# Patient Record
Sex: Female | Born: 1935 | Race: White | Hispanic: No | Smoking: Former smoker
Health system: Southern US, Community
[De-identification: ages and names within clinical notes are randomized; demographics above are authoritative.]

## PROBLEM LIST (undated history)

## (undated) DIAGNOSIS — I10 Essential (primary) hypertension: Secondary | ICD-10-CM

## (undated) DIAGNOSIS — F419 Anxiety disorder, unspecified: Secondary | ICD-10-CM

## (undated) DIAGNOSIS — J449 Chronic obstructive pulmonary disease, unspecified: Secondary | ICD-10-CM

## (undated) DIAGNOSIS — E785 Hyperlipidemia, unspecified: Secondary | ICD-10-CM

## (undated) DIAGNOSIS — I639 Cerebral infarction, unspecified: Secondary | ICD-10-CM

## (undated) DIAGNOSIS — F0390 Unspecified dementia without behavioral disturbance: Secondary | ICD-10-CM

## (undated) HISTORY — DX: Anxiety disorder, unspecified: F41.9

## (undated) HISTORY — DX: Hyperlipidemia, unspecified: E78.5

## (undated) HISTORY — DX: Cerebral infarction, unspecified: I63.9

## (undated) HISTORY — DX: Chronic obstructive pulmonary disease, unspecified: J44.9

## (undated) HISTORY — DX: Essential (primary) hypertension: I10

## (undated) HISTORY — DX: Unspecified dementia, unspecified severity, without behavioral disturbance, psychotic disturbance, mood disturbance, and anxiety: F03.90

---

## 1974-09-30 HISTORY — PX: APPENDECTOMY: SHX54

## 1974-09-30 HISTORY — PX: VESICOVAGINAL FISTULA CLOSURE W/ TAH: SUR271

## 1998-09-30 DIAGNOSIS — I639 Cerebral infarction, unspecified: Secondary | ICD-10-CM

## 1998-09-30 HISTORY — DX: Cerebral infarction, unspecified: I63.9

## 2000-10-09 ENCOUNTER — Ambulatory Visit: Admission: RE | Admit: 2000-10-09 | Discharge: 2000-10-09 | Payer: Self-pay | Admitting: Vascular Surgery

## 2000-10-09 ENCOUNTER — Encounter: Payer: Self-pay | Admitting: Vascular Surgery

## 2000-10-20 ENCOUNTER — Inpatient Hospital Stay (HOSPITAL_COMMUNITY): Admission: RE | Admit: 2000-10-20 | Discharge: 2000-10-21 | Payer: Self-pay | Admitting: Vascular Surgery

## 2001-06-15 ENCOUNTER — Encounter: Payer: Self-pay | Admitting: Family Medicine

## 2001-06-15 ENCOUNTER — Ambulatory Visit (HOSPITAL_COMMUNITY): Admission: RE | Admit: 2001-06-15 | Discharge: 2001-06-15 | Payer: Self-pay | Admitting: Pediatrics

## 2002-07-06 ENCOUNTER — Encounter: Payer: Self-pay | Admitting: Family Medicine

## 2002-07-06 ENCOUNTER — Ambulatory Visit (HOSPITAL_COMMUNITY): Admission: RE | Admit: 2002-07-06 | Discharge: 2002-07-06 | Payer: Self-pay | Admitting: Family Medicine

## 2002-09-10 ENCOUNTER — Ambulatory Visit (HOSPITAL_COMMUNITY): Admission: RE | Admit: 2002-09-10 | Discharge: 2002-09-10 | Payer: Self-pay | Admitting: Internal Medicine

## 2003-03-10 ENCOUNTER — Ambulatory Visit (HOSPITAL_COMMUNITY): Admission: RE | Admit: 2003-03-10 | Discharge: 2003-03-10 | Payer: Self-pay

## 2003-07-11 ENCOUNTER — Encounter: Payer: Self-pay | Admitting: Internal Medicine

## 2003-07-11 ENCOUNTER — Ambulatory Visit (HOSPITAL_COMMUNITY): Admission: RE | Admit: 2003-07-11 | Discharge: 2003-07-11 | Payer: Self-pay | Admitting: Internal Medicine

## 2004-07-12 ENCOUNTER — Ambulatory Visit (HOSPITAL_COMMUNITY): Admission: RE | Admit: 2004-07-12 | Discharge: 2004-07-12 | Payer: Self-pay | Admitting: Internal Medicine

## 2006-06-04 ENCOUNTER — Ambulatory Visit: Payer: Self-pay | Admitting: Internal Medicine

## 2006-06-06 ENCOUNTER — Ambulatory Visit (HOSPITAL_COMMUNITY): Admission: RE | Admit: 2006-06-06 | Discharge: 2006-06-06 | Payer: Self-pay | Admitting: Internal Medicine

## 2006-06-06 ENCOUNTER — Ambulatory Visit: Payer: Self-pay | Admitting: Internal Medicine

## 2013-03-08 ENCOUNTER — Other Ambulatory Visit: Payer: Self-pay | Admitting: Internal Medicine

## 2013-03-08 DIAGNOSIS — R921 Mammographic calcification found on diagnostic imaging of breast: Secondary | ICD-10-CM

## 2013-03-18 ENCOUNTER — Ambulatory Visit
Admission: RE | Admit: 2013-03-18 | Discharge: 2013-03-18 | Disposition: A | Discharge status: Home / Self Care | Payer: Medicare Other | Source: Ambulatory Visit | Attending: Internal Medicine | Admitting: Internal Medicine

## 2013-03-18 DIAGNOSIS — R921 Mammographic calcification found on diagnostic imaging of breast: Secondary | ICD-10-CM

## 2015-07-11 ENCOUNTER — Ambulatory Visit (INDEPENDENT_AMBULATORY_CARE_PROVIDER_SITE_OTHER): Payer: Medicare Other | Admitting: Internal Medicine

## 2015-07-11 ENCOUNTER — Ambulatory Visit (INDEPENDENT_AMBULATORY_CARE_PROVIDER_SITE_OTHER)
Admission: RE | Admit: 2015-07-11 | Discharge: 2015-07-11 | Disposition: A | Discharge status: Home / Self Care | Payer: Medicare Other | Source: Ambulatory Visit | Attending: Internal Medicine | Admitting: Internal Medicine

## 2015-07-11 ENCOUNTER — Encounter: Payer: Self-pay | Admitting: Internal Medicine

## 2015-07-11 ENCOUNTER — Other Ambulatory Visit (INDEPENDENT_AMBULATORY_CARE_PROVIDER_SITE_OTHER): Payer: Medicare Other

## 2015-07-11 VITALS — BP 104/64 | HR 87 | Ht 63.0 in | Wt 134.0 lb

## 2015-07-11 DIAGNOSIS — R0609 Other forms of dyspnea: Secondary | ICD-10-CM

## 2015-07-11 DIAGNOSIS — J449 Chronic obstructive pulmonary disease, unspecified: Secondary | ICD-10-CM

## 2015-07-11 DIAGNOSIS — J9611 Chronic respiratory failure with hypoxia: Secondary | ICD-10-CM

## 2015-07-11 DIAGNOSIS — R0600 Dyspnea, unspecified: Secondary | ICD-10-CM

## 2015-07-11 LAB — BASIC METABOLIC PANEL
BUN: 16 mg/dL (ref 6–23)
CO2: 30 mEq/L (ref 19–32)
Calcium: 9.4 mg/dL (ref 8.4–10.5)
Chloride: 94 mEq/L — ABNORMAL LOW (ref 96–112)
Creatinine, Ser: 0.97 mg/dL (ref 0.40–1.20)
GFR: 59.03 mL/min — AB (ref >60.00)
Glucose, Bld: 94 mg/dL (ref 70–99)
POTASSIUM: 4.2 meq/L (ref 3.5–5.1)
Sodium: 133 mEq/L — ABNORMAL LOW (ref 135–145)

## 2015-07-11 LAB — CBC WITH DIFFERENTIAL/PLATELET
BASOS ABS: 0.1 10*3/uL (ref 0.0–0.1)
Basophils Relative: 0.5 % (ref 0.0–3.0)
Eosinophils Absolute: 2.2 10*3/uL — ABNORMAL HIGH (ref 0.0–0.7)
Eosinophils Relative: 19.5 % — ABNORMAL HIGH (ref 0.0–5.0)
HCT: 40.7 % (ref 36.0–46.0)
HEMOGLOBIN: 13.3 g/dL (ref 12.0–15.0)
LYMPHS ABS: 3.4 10*3/uL (ref 0.7–4.0)
Lymphocytes Relative: 29.9 % (ref 12.0–46.0)
MCHC: 32.8 g/dL (ref 30.0–36.0)
MCV: 87 fl (ref 78.0–100.0)
MONO ABS: 0.6 10*3/uL (ref 0.1–1.0)
MONOS PCT: 5.5 % (ref 3.0–12.0)
NEUTROS PCT: 44.6 % (ref 43.0–77.0)
Neutro Abs: 5.1 10*3/uL (ref 1.4–7.7)
Platelets: 273 10*3/uL (ref 150.0–400.0)
RBC: 4.67 Mil/uL (ref 3.87–5.11)
RDW: 13.5 % (ref 11.5–15.5)
WBC: 11.4 10*3/uL — AB (ref 4.0–10.5)

## 2015-07-11 LAB — TSH: TSH: 0.83 u[IU]/mL (ref 0.35–4.50)

## 2015-07-11 LAB — BRAIN NATRIURETIC PEPTIDE: PRO B NATRI PEPTIDE: 127 pg/mL — AB (ref 0.0–100.0)

## 2015-07-11 MED ORDER — PREDNISONE 10 MG PO TABS: ORAL_TABLET | ORAL | Status: DC

## 2015-07-11 MED ORDER — FLUTTER DEVI: Status: AC

## 2015-07-11 NOTE — Progress Notes (Signed)
Quick Note:  Spoke with pt and notified of results per Dr. Wert. Pt verbalized understanding and denied any questions.  ______ 

## 2015-07-11 NOTE — Patient Instructions (Addendum)
Prednisone 10 mg take  4 each am x 2 days,   2 each am x 2 days,  1 each am x 2 days and stop   duoneb is four times daily   Only use your albuterol (Proair)  as a rescue medication to be used if you can't catch your breath by resting or doing a relaxed purse lip breathing pattern.  - The less you use it, the better it will work when you need it. - Ok to use up to 2 puffs  every 4 hours if you must but call for immediate appointment if use goes up over your usual need - Don't leave home without it !!  (think of it like the spare tire for your car)   Work on inhaler technique:  relax and gently blow all the way out then take a nice smooth deep breath back in, triggering the inhaler at same time you start breathing in.  Hold for up to 5 seconds if you can. Blow out thru nose. Rinse and gargle with water when done   Omeprazole Take 30- 60 min before your first and last meals of the day and Add pepcid 20 mg at bedtime   GERD (REFLUX)  is an extremely common cause of respiratory symptoms just like yours , many times with no obvious heartburn at all.    It can be treated with medication, but also with lifestyle changes including elevation of the head of your bed (ideally with 6 inch  bed blocks),  Smoking cessation, avoidance of late meals, excessive alcohol, and avoid fatty foods, chocolate, peppermint, colas, red wine, and acidic juices such as orange juice.  NO MINT OR MENTHOL PRODUCTS SO NO COUGH DROPS  USE SUGARLESS CANDY INSTEAD (Jolley ranchers or Stover's or Life Savers) or even ice chips will also do - the key is to swallow to prevent all throat clearing. NO OIL BASED VITAMINS - use powdered substitutes.  When cough use the flutter valve as much as possible     Please remember to go to the lab and x-ray department downstairs for your tests - we will call you with the results when they are available.  Please schedule a follow up office visit in 2 weeks, sooner if needed

## 2015-07-11 NOTE — Progress Notes (Signed)
Subjective:    Patient ID: Anita Madden, female    DOB: [birth year],    MRN: [id number]  HPI  35 yowf quit smoking at admit to [facility] in 06/2015 > aecopd > to rehab and d/c 07/06/15 back home newly on 02 2lpm 24/7 and referred to pulmonary clinic by Dr Sherril Croon 07/11/2015   07/11/2015 1st [facility] office visit/ Anik Wesch   Chief Complaint  Patient presents with  . Pulmonary Consult    Referred by Dr. Doreen Beam. Pt states she was dxed with COPD approx 1 yr ago. Her SOB and cough has been worse for the past 2 months. She states that she has alot of trouble with her breathing first thing in the am. Her cough is prod with minimal clear/white sputum. She is using Duoneb 3-4 x per day.    baseline prior to aecopd = shopping at a mall ok / main problem was carrying groceries in from car to kitchen on advair and occ proair but now duoneb ? 8 x daily / confused with details of care per fm and sob x 10 ft despite 02   No obvious other patterns in day to day or daytime variabilty or assoc   cp or chest tightness, subjective wheeze overt sinus or hb symptoms. No unusual exp hx or h/o childhood pna/ asthma or knowledge of premature birth.  Sleeping ok without nocturnal  or early am exacerbation  of respiratory  c/o's or need for noct saba. Also denies any obvious fluctuation of symptoms with weather or environmental changes or other aggravating or alleviating factors except as outlined above   Current Medications, Allergies, Complete Past Medical History, Past Surgical History, Family History, and Social History were reviewed in [employer] record.              Review of Systems  Constitutional: Negative for fever, chills and unexpected weight change.  HENT: Positive for trouble swallowing. Negative for congestion, dental problem, ear pain, nosebleeds, postnasal drip, rhinorrhea, sinus pressure, sneezing, sore throat and voice change.   Eyes: Negative for visual  disturbance.  Respiratory: Positive for cough and shortness of breath. Negative for choking.   Cardiovascular: Negative for chest pain and leg swelling.  Gastrointestinal: Negative for vomiting, abdominal pain and diarrhea.  Genitourinary: Negative for difficulty urinating.  Musculoskeletal: Negative for arthralgias.  Skin: Negative for rash.  Neurological: Negative for tremors, syncope and headaches.  Hematological: Does not bruise/bleed easily.       Objective:   Physical Exam  W/c bound wf nad at rest on 02   Wt Readings from Last 3 Encounters:  07/11/15 134 lb (60.782 kg)    Vital signs reviewed        HEENT: nl dentition, turbinates, and orophanx. Nl external ear canals without cough reflex   NECK :  without JVD/Nodes/TM/ nl carotid upstrokes bilaterally   LUNGS: no acc muscle use,  Insp/ exp rhonchi    CV:  RRR  no s3 or murmur or increase in P2, no edema   ABD:  soft and nontender with nl excursion in the supine position. No bruits or organomegaly, bowel sounds nl  MS:  warm without deformities, calf tenderness, cyanosis or clubbing  SKIN: warm and dry without lesions    NEURO:  alert, approp, no deficits    CXR PA and Lateral:   07/11/2015 :    I personally reviewed images and agree with radiology impression as follows:   COPD. No  active disease.    Labs ordered/ reviewed:  Lab 07/11/15 1143  NA 133*  K 4.2  CL 94*  CO2 30  BUN 16  CREATININE 0.97  GLUCOSE 94   Lab 07/11/15 1143  HGB 13.3  HCT 40.7  WBC 11.4*  PLT 273.0     Lab Results  Component Value Date   TSH 0.83 07/11/2015     Lab Results  Component Value Date   PROBNP 127.0* 07/11/2015             Assessment & Plan:

## 2015-07-12 ENCOUNTER — Encounter: Payer: Self-pay | Admitting: Internal Medicine

## 2015-07-12 DIAGNOSIS — J449 Chronic obstructive pulmonary disease, unspecified: Secondary | ICD-10-CM | POA: Insufficient documentation

## 2015-07-12 DIAGNOSIS — J9611 Chronic respiratory failure with hypoxia: Secondary | ICD-10-CM | POA: Insufficient documentation

## 2015-07-12 NOTE — Assessment & Plan Note (Addendum)
C/w deconditioning/ copd > see sep a/p

## 2015-07-12 NOTE — Assessment & Plan Note (Signed)
D/c on 02 2lpm 06/2015   Adequate control on present rx, reviewed > no change in rx needed

## 2015-07-12 NOTE — Assessment & Plan Note (Addendum)
Her baseline by report was pretty good on advair and prn proair and now despite stopping smoking is on max neb rx and still very symptomatic.  DDX of  difficult airways management all start with A and  include Adherence, Ace Inhibitors, Acid Reflux, Active Sinus Disease, Alpha 1 Antitripsin deficiency, Anxiety masquerading as Airways dz,  ABPA,  allergy(esp in young), Aspiration (esp in elderly), Adverse effects of meds,  Active smokers, A bunch of PE's (a small clot burden can't cause this syndrome unless there is already severe underlying pulm or vascular dz with poor reserve) plus two Bs  = Bronchiectasis and Beta blocker use..and one C= CHF  Adherence is always the initial "prime suspect" and is a multilayered concern that requires a "trust but verify" approach in every patient - starting with knowing how to use medications, especially inhalers, correctly, keeping up with refills and understanding the fundamental difference between maintenance and prns vs those medications only taken for a very short course and then stopped and not refilled.  - The proper method of use, as well as anticipated side effects, of a metered-dose inhaler are discussed and demonstrated to the patient. Improved effectiveness after extensive coaching during this visit to a level of approximately  75% from a baseline of 25% so needs work  ?Active smoking > denies.   I reviewed the Fletcher curve with the patient that basically indicates  if you quit smoking when your best day FEV1 is still well preserved (as is suggested by her reported baseline  here)  it is highly unlikely you will progress to severe disease and informed the patient there was no medication on the market that has proven to alter the curve/ its downward trajectory  or the likelihood of progression of their disease.  Therefore stopping smoking and maintaining abstinence is the most important aspect of care, not choice of inhalers or for that matter, doctors.       ? Acid (or non-acid) GERD > always difficult to exclude as up to 75% of pts in some series report no assoc GI/ Heartburn symptoms> rec max (24h)  acid suppression and diet restrictions/ reviewed and instructions given in writing.  ? Allergy/asthma component > Prednisone 10 mg take  4 each am x 2 days,   2 each am x 2 days,  1 each am x 2 days and stop  ? chf > unlikely based on today's bnp  We need to regroup in 2 weeks to sort out her long term needs and in meantime she needs to work of following the instructions already given. The principal here is that until we are certain that the  patients are doing what we've asked  Them  to do, it makes no sense to ask them to do more - the trust but verify approach will work best here.    I had an extended discussion with the patient reviewing all relevant studies completed to date and  lasting 35 / 60 minute visit    Each maintenance medication was reviewed in detail including most importantly the difference between maintenance and prns and under what circumstances the prns are to be triggered using an action plan format that is not reflected in the computer generated alphabetically organized AVS.    Please see instructions for details which were reviewed in writing and the patient given a copy highlighting the part that I personally wrote and discussed at today's ov.

## 2015-07-13 ENCOUNTER — Encounter (INDEPENDENT_AMBULATORY_CARE_PROVIDER_SITE_OTHER): Payer: Self-pay | Admitting: *Deleted

## 2015-07-14 ENCOUNTER — Telehealth: Payer: Self-pay | Admitting: Internal Medicine

## 2015-07-14 DIAGNOSIS — J449 Chronic obstructive pulmonary disease, unspecified: Secondary | ICD-10-CM

## 2015-07-14 NOTE — Telephone Encounter (Signed)
Spoke with daughter in law Anita Madden. She reports she spoke with Dr. Sherril CroonVyas to order a POC and was told they needed to call Dr. Sherene SiresWert to order this. Pt has large tanks and not able to carry these around. Please advise MW thanks

## 2015-07-14 NOTE — Telephone Encounter (Signed)
Spoke with pt's daughter in [employer] traderlaw angela.  Advised her of MW's recommendations.  Order entered for POC Titration.  Nothing further needed.

## 2015-07-14 NOTE — Telephone Encounter (Signed)
Fine to do POC titration to see if eligible

## 2015-07-25 ENCOUNTER — Ambulatory Visit (INDEPENDENT_AMBULATORY_CARE_PROVIDER_SITE_OTHER): Payer: Medicare Other | Admitting: Internal Medicine

## 2015-07-25 ENCOUNTER — Encounter: Payer: Self-pay | Admitting: Internal Medicine

## 2015-07-25 VITALS — BP 128/62 | HR 85 | Ht 63.0 in | Wt 131.0 lb

## 2015-07-25 DIAGNOSIS — Z23 Encounter for immunization: Secondary | ICD-10-CM

## 2015-07-25 DIAGNOSIS — J449 Chronic obstructive pulmonary disease, unspecified: Secondary | ICD-10-CM

## 2015-07-25 DIAGNOSIS — J9611 Chronic respiratory failure with hypoxia: Secondary | ICD-10-CM

## 2015-07-25 MED ORDER — BUDESONIDE-FORMOTEROL FUMARATE 160-4.5 MCG/ACT IN AERO: INHALATION_SPRAY | RESPIRATORY_TRACT | Status: DC

## 2015-07-25 MED ORDER — PREDNISONE 10 MG PO TABS: ORAL_TABLET | ORAL | Status: DC

## 2015-07-25 NOTE — Progress Notes (Signed)
Subjective:    Patient ID: Anita Madden, female    DOB: 1935/08/22,    MRN: [id number]    Brief patient profile:  47 yowf quit smoking at admit to [facility] in 06/2015 > aecopd > to rehab and d/c 07/06/15 back home newly on 02 2lpm 24/7 and referred to pulmonary clinic by Dr Sherril Croon 07/11/2015   History of Present Illness  07/11/2015 1st [facility] office visit/ Jull Harral   Chief Complaint  Patient presents with  . Pulmonary Consult    Referred by Dr. Doreen Beam. Pt states she was dxed with COPD approx 1 yr ago. Her SOB and cough has been worse for the past 2 months. She states that she has alot of trouble with her breathing first thing in the am. Her cough is prod with minimal clear/white sputum. She is using Duoneb 3-4 x per day.    baseline prior to aecopd = shopping at a mall ok / main problem was carrying groceries in from car to kitchen on advair and occ proair but now duoneb ? 8 x daily / confused with details of care per fm and sob x 10 ft despite 02  rec Prednisone 10 mg take  4 each am x 2 days,   2 each am x 2 days,  1 each am x 2 days and stop  duoneb is four times daily  Only use your albuterol (Proair)  Work on inhaler technique:   Omeprazole Take 30- 60 min before your first and last meals of the day and Add pepcid 20 mg at bedtime  GERD diet When cough use the flutter valve as much as possible     07/25/2015  f/u ov/Hetal Proano re: copd/ ? Still 02 dep Chief Complaint  Patient presents with  . Follow-up    Pt following for COPD: pt states she is doing ok. pt states she was much better on the prednisone, activity, breathing and mentally. pt c/o some SOB, and prod cough clear in color. no c/o chest tightness. pt in continuous O2 at 2LPM.  night and day difference on prednisone and losing ground since she initiated her short course with increasing dyspnea and cough along with chest tightness that does respond to albuterol despite poor HFA technique.  No obvious day  to day or daytime variability or assoc excess/ purulent sputum or cp or chest tightness, subjective wheeze or overt sinus or hb symptoms. No unusual exp hx or h/o childhood pna/ asthma or knowledge of premature birth.  Sleeping ok on 02 without nocturnal  or early am exacerbation  of respiratory  c/o's or need for noct saba. Also denies any obvious fluctuation of symptoms with weather or environmental changes or other aggravating or alleviating factors except as outlined above   Current Medications, Allergies, Complete Past Medical History, Past Surgical History, Family History, and Social History were reviewed in [employer] record.  ROS  The following are not active complaints unless bolded sore throat, dysphagia, dental problems, itching, sneezing,  nasal congestion or excess/ purulent secretions, ear ache,   fever, chills, sweats, unintended wt loss, classically pleuritic or exertional cp, hemoptysis,  orthopnea pnd or leg swelling, presyncope, palpitations, abdominal pain, anorexia, nausea, vomiting, diarrhea  or change in bowel or bladder habits, change in stools or urine, dysuria,hematuria,  rash, arthralgias, visual complaints, headache, numbness, weakness or ataxia or problems with walking or coordination,  change in mood/affect or memory.  Objective:   Physical Exam  No longer W/c bound wf nad at rest  Off 02     Wt Readings from Last 3 Encounters:  07/25/15 131 lb (59.421 kg)  07/11/15 134 lb (60.782 kg)    Vital signs reviewed    HEENT: nl dentition, turbinates, and orophanx. Nl external ear canals without cough reflex   NECK :  without JVD/Nodes/TM/ nl carotid upstrokes bilaterally   LUNGS: no acc muscle use,  Insp/ exp rhonchi bilaterally mild/moderate    CV:  RRR  no s3 or murmur or increase in P2, no edema   ABD:  soft and nontender with nl excursion in the supine position. No bruits or organomegaly, bowel  sounds nl  MS:  warm without deformities, calf tenderness, cyanosis or clubbing  SKIN: warm and dry without lesions    NEURO:  alert, approp, no deficits    CXR PA and Lateral:   07/11/2015 :    I personally reviewed images and agree with radiology impression as follows:   COPD. No active disease.    Labs ordered/ reviewed:  Lab 07/11/15 1143  NA 133*  K 4.2  CL 94*  CO2 30  BUN 16  CREATININE 0.97  GLUCOSE 94   Lab 07/11/15 1143  HGB 13.3  HCT 40.7  WBC 11.4*  PLT 273.0     Lab Results  Component Value Date   TSH 0.83 07/11/2015     Lab Results  Component Value Date   PROBNP 127.0* 07/11/2015             Assessment & Plan:

## 2015-07-25 NOTE — Patient Instructions (Addendum)
Start symbicort 160 Take 2 puffs first thing in am and then another 2 puffs about 12 hours later.   Plan B  - Only use your albuterol as a rescue medication to be used if you can't catch your breath by resting or doing a relaxed purse lip breathing pattern.  - The less you use it, the better it will work when you need it. - Ok to use up to 2 puffs  every 4 hours if you must but call for immediate appointment if use goes up over your usual need - Don't leave home without it !!  (think of it like the spare tire for your car)   Plan C  - duoneb if you try plan B first and it doesn't work   Prednisone 10 mg  4 for two days three for two days two for two days one for two days   Prevnar today   Please schedule a follow up office visit in 4 weeks, sooner if needed

## 2015-07-26 ENCOUNTER — Telehealth: Payer: Self-pay | Admitting: *Deleted

## 2015-07-26 ENCOUNTER — Encounter: Payer: Self-pay | Admitting: Internal Medicine

## 2015-07-26 NOTE — Telephone Encounter (Signed)
lmtcb x1 for pt. 

## 2015-07-26 NOTE — Assessment & Plan Note (Signed)
D/cd on 02 2lpm 06/2015 from [facility] - 07/25/2015  Walked RA x 3 laps @ 185 ft each stopped due to  End of study, slow pace, no sob or desat  > rec hs 02 only

## 2015-07-26 NOTE — Assessment & Plan Note (Signed)
Her stated baseline lung function before her most recent acute exacerbation and her response to prednisone suggest that she's got a large asthmatic component with a relatively small COPD component and certainly could have the asthma COPD overlap syndrome so we need to focus first on treating the asthmatic component and have her return for PFTs at that point  The proper method of use, as well as anticipated side effects, of a metered-dose inhaler are discussed and demonstrated to the patient. Improved effectiveness after extensive coaching during this visit to a level of approximately  75% from a baseline of 25% so needs more teaching with each ov  I had an extended discussion with the patient and her son reviewing all relevant studies completed to date and  lasting 15 to 20 minutes of a 25 minute visit    Each maintenance medication was reviewed in detail including most importantly the difference between maintenance and prns and under what circumstances the prns are to be triggered using an action plan format that is not reflected in the computer generated alphabetically organized AVS.    Please see instructions for details which were reviewed in writing and the patient given a copy highlighting the part that I personally wrote and discussed at today's ov.

## 2015-07-26 NOTE — Telephone Encounter (Signed)
-----   Message from Nyoka CowdenMichael B Wert, MD sent at 07/26/2015 12:03 PM EDT ----- Need to set her up for PFTs on return

## 2015-07-27 NOTE — Telephone Encounter (Signed)
Spoke with the pt's daughter, Marylene Landngela and scheduled pft and ov with MW for 09/18/15

## 2015-08-03 ENCOUNTER — Telehealth: Payer: Self-pay | Admitting: *Deleted

## 2015-08-03 NOTE — Telephone Encounter (Signed)
-----   Message from Henderson NewcomerMelissa Stenson sent at 08/03/2015  4:42 PM EDT ----- This patient did not show to her POC eval. When the therapist called to reschedule the patient stated that she does not want a POC at this time and that she is happy with her current portable oxygen set up. She states that she will call if she changes her mind.  Thanks General MillsMelissa

## 2015-08-03 NOTE — Telephone Encounter (Signed)
noted 

## 2015-08-15 ENCOUNTER — Ambulatory Visit (INDEPENDENT_AMBULATORY_CARE_PROVIDER_SITE_OTHER): Payer: Medicare Other | Admitting: Internal Medicine

## 2015-08-22 ENCOUNTER — Ambulatory Visit: Payer: Medicare Other | Admitting: Internal Medicine

## 2015-09-18 ENCOUNTER — Ambulatory Visit: Payer: Medicare Other | Admitting: Internal Medicine

## 2015-10-11 ENCOUNTER — Other Ambulatory Visit: Payer: Self-pay | Admitting: Internal Medicine

## 2015-10-11 DIAGNOSIS — R0600 Dyspnea, unspecified: Secondary | ICD-10-CM

## 2015-10-12 ENCOUNTER — Ambulatory Visit (INDEPENDENT_AMBULATORY_CARE_PROVIDER_SITE_OTHER): Payer: Medicare Other | Admitting: Internal Medicine

## 2015-10-12 ENCOUNTER — Encounter: Payer: Self-pay | Admitting: Internal Medicine

## 2015-10-12 VITALS — BP 118/70 | HR 86 | Ht 63.0 in | Wt 132.0 lb

## 2015-10-12 DIAGNOSIS — J449 Chronic obstructive pulmonary disease, unspecified: Secondary | ICD-10-CM | POA: Diagnosis not present

## 2015-10-12 DIAGNOSIS — R0600 Dyspnea, unspecified: Secondary | ICD-10-CM | POA: Diagnosis not present

## 2015-10-12 LAB — PULMONARY FUNCTION TEST
DL/VA % pred: 62 %
DL/VA: 2.94 ml/min/mmHg/L
DLCO UNC % PRED: 41 %
DLCO UNC: 9.5 ml/min/mmHg
FEF 25-75 POST: 0.48 L/s
FEF 25-75 PRE: 0.41 L/s
FEF2575-%CHANGE-POST: 15 %
FEF2575-%PRED-POST: 33 %
FEF2575-%PRED-PRE: 28 %
FEV1-%Change-Post: 5 %
FEV1-%PRED-POST: 59 %
FEV1-%Pred-Pre: 56 %
FEV1-POST: 1.13 L
FEV1-Pre: 1.07 L
FEV1FVC-%CHANGE-POST: 3 %
FEV1FVC-%PRED-PRE: 76 %
FEV6-%CHANGE-POST: 2 %
FEV6-%PRED-POST: 77 %
FEV6-%Pred-Pre: 75 %
FEV6-Post: 1.87 L
FEV6-Pre: 1.83 L
FEV6FVC-%CHANGE-POST: 1 %
FEV6FVC-%Pred-Post: 102 %
FEV6FVC-%Pred-Pre: 101 %
FVC-%CHANGE-POST: 1 %
FVC-%PRED-POST: 74 %
FVC-%Pred-Pre: 74 %
FVC-Post: 1.92 L
FVC-Pre: 1.9 L
POST FEV1/FVC RATIO: 59 %
Post FEV6/FVC ratio: 98 %
Pre FEV1/FVC ratio: 57 %
Pre FEV6/FVC Ratio: 97 %
RV % pred: 130 %
RV: 3 L
TLC % pred: 102 %
TLC: 5 L

## 2015-10-12 NOTE — Patient Instructions (Signed)
Plan A = automatic >>> Start symbicort 160 Take 2 puffs first thing in am and then another 2 puffs about 12 hours later.   Plan B  - Only use your albuterol (Proair) as a rescue medication to be used if you can't catch your breath by resting or doing a relaxed purse lip breathing pattern.  - The less you use it, the better it will work when you need it. - Ok to use up to 2 puffs  every 4 hours if you must but call for immediate appointment if use goes up over your usual need - Don't leave home without it !!  (think of it like the spare tire for your car)   Plan C  - duoneb if you try plan B first and it doesn't work    If you are satisfied with your treatment plan,  let your doctor know and he/she can either refill your medications or you can return here when your prescription runs out.     If in any way you are not 100% satisfied,  please tell us.  If 100% better, tell your friends!  Pulmonary follow up is as needed

## 2015-10-12 NOTE — Progress Notes (Signed)
Subjective:    Patient ID: Anita Madden, female    DOB: 1934/12/02    MRN: [id number]    Brief patient profile:  80 yowf quit smoking at admit to [facility] in 06/2015 > aecopd > to rehab and d/c 07/06/15 back home newly on 02 2lpm 24/7 and referred to pulmonary clinic by Dr Sherril Croon 07/11/2015 proved to have GOLD II criteria 10/12/2015    History of Present Illness  07/11/2015 1st [facility] office visit/ Defne Gerling   Chief Complaint  Patient presents with  . Pulmonary Consult    Referred by Dr. Doreen Beam. Pt states she was dxed with COPD approx 1 yr ago. Her SOB and cough has been worse for the past 2 months. She states that she has alot of trouble with her breathing first thing in the am. Her cough is prod with minimal clear/white sputum. She is using Duoneb 3-4 x per day.    baseline prior to aecopd = shopping at a mall ok / main problem was carrying groceries in from car to kitchen on advair and occ proair but now duoneb ? 8 x daily / confused with details of care per fm and sob x 10 ft despite 02  rec Prednisone 10 mg take  4 each am x 2 days,   2 each am x 2 days,  1 each am x 2 days and stop  duoneb is four times daily  Only use your albuterol (Proair)  Work on inhaler technique:   Omeprazole Take 30- 60 min before your first and last meals of the day and Add pepcid 20 mg at bedtime  GERD diet When cough use the flutter valve as much as possible     07/25/2015  f/u ov/Lanie Schelling re: copd/ ? Still 02 dep Chief Complaint  Patient presents with  . Follow-up    Pt following for COPD: pt states she is doing ok. pt states she was much better on the prednisone, activity, breathing and mentally. pt c/o some SOB, and prod cough clear in color. no c/o chest tightness. pt in continuous O2 at 2LPM.  night and day difference on prednisone and losing ground since she initiated her short course with increasing dyspnea and cough along with chest tightness that does respond to albuterol  despite poor HFA technique. rec Start symbicort 160 Take 2 puffs first thing in am and then another 2 puffs about 12 hours later.  Plan B  - Only use your albuterol as a rescue medication   Plan C  - duoneb if you try plan B first and it doesn't work  Prednisone 10 mg  4 for two days three for two days two for two days one for two days  Prevnar today    10/12/2015  f/u ov/Virgia Kelner re:  GOLD II copd  Chief Complaint  Patient presents with  . Follow-up    Pt states that her breathing is doing well. She is only really using o2 with sleep. She uses neb 2-3 x per day.   only use 02 hs   On symbiocort 160 2bid plus freq saba but overall much better    No obvious day to day or daytime variability or assoc excess/ purulent sputum or cp or chest tightness, subjective wheeze or overt sinus or hb symptoms. No unusual exp hx or h/o childhood pna/ asthma or knowledge of premature birth.  Sleeping ok on 02 without nocturnal  or early am exacerbation  of respiratory  c/o's  or need for noct saba. Also denies any obvious fluctuation of symptoms with weather or environmental changes or other aggravating or alleviating factors except as outlined above   Current Medications, Allergies, Complete Past Medical History, Past Surgical History, Family History, and Social History were reviewed in [facility] electronic medical record.  ROS  The following are not active complaints unless bolded sore throat, dysphagia, dental problems, itching, sneezing,  nasal congestion or excess/ purulent secretions, ear ache,   fever, chills, sweats, unintended wt loss, classically pleuritic or exertional cp, hemoptysis,  orthopnea pnd or leg swelling, presyncope, palpitations, abdominal pain, anorexia, nausea, vomiting, diarrhea  or change in bowel or bladder habits, change in stools or urine, dysuria,hematuria,  rash, arthralgias, visual complaints, headache, numbness, weakness or ataxia or problems with walking or coordination,   change in mood/affect or memory.             Objective:   Physical Exam      amb wf nad    Wt Readings from Last 3 Encounters:  10/12/15 132 lb (59.875 kg)  07/25/15 131 lb (59.421 kg)  07/11/15 134 lb (60.782 kg)    Vital signs reviewed   HEENT: nl dentition, turbinates, and orophanx. Nl external ear canals without cough reflex   NECK :  without JVD/Nodes/TM/ nl carotid upstrokes bilaterally   LUNGS: no acc muscle use,  Distant bs s wheeze      CV:  RRR  no s3 or murmur or increase in P2, no edema   ABD:  soft and nontender with nl excursion in the supine position. No bruits or organomegaly, bowel sounds nl  MS:  warm without deformities, calf tenderness, cyanosis or clubbing  SKIN: warm and dry without lesions    NEURO:  alert, approp, no deficits    CXR PA and Lateral:   07/11/2015 :    I personally reviewed images and agree with radiology impression as follows:   COPD. No active disease.    Labs reviewed:  Lab 07/11/15 1143  NA 133*  K 4.2  CL 94*  CO2 30  BUN 16  CREATININE 0.97  GLUCOSE 94   Lab 07/11/15 1143  HGB 13.3  HCT 40.7  WBC 11.4*  PLT 273.0     Lab Results  Component Value Date   TSH 0.83 07/11/2015     Lab Results  Component Value Date   PROBNP 127.0* 07/11/2015             Assessment & Plan:

## 2015-10-12 NOTE — Progress Notes (Signed)
PFT done today. 

## 2015-10-15 ENCOUNTER — Encounter: Payer: Self-pay | Admitting: Internal Medicine

## 2015-10-15 NOTE — Assessment & Plan Note (Signed)
-   PFT's  10/12/2015  FEV1 1.13 (59 % ) ratio 59  p % improvement from saba with DLCO  41 % corrects to 62 % for alv volume    Only GOLD II with improved activity tol back to baseline so nothing further needed for now other than work on perfecting hfa   - The proper method of use, as well as anticipated side effects, of a metered-dose inhaler are discussed and demonstrated to the patient. Improved effectiveness after extensive coaching during this visit to a level of approximately 75 % from a baseline of 50 %   I had an extended discussion with the patient reviewing all relevant studies completed to date and  lasting 15 to 20 minutes of a 25 minute visit    Each maintenance medication was reviewed in detail including most importantly the difference between maintenance and prns and under what circumstances the prns are to be triggered using an action plan format that is not reflected in the computer generated alphabetically organized AVS.    Please see instructions for details which were reviewed in writing and the patient given a copy highlighting the part that I personally wrote and discussed at today's ov.

## 2016-12-12 ENCOUNTER — Other Ambulatory Visit: Payer: Self-pay | Admitting: Internal Medicine

## 2017-07-10 ENCOUNTER — Inpatient Hospital Stay: Payer: Medicare Other | Admitting: Acute Care

## 2017-07-11 ENCOUNTER — Inpatient Hospital Stay: Payer: Medicare Other | Admitting: Adult Health

## 2017-12-29 ENCOUNTER — Encounter: Payer: Self-pay | Admitting: *Deleted

## 2017-12-30 ENCOUNTER — Ambulatory Visit (INDEPENDENT_AMBULATORY_CARE_PROVIDER_SITE_OTHER): Payer: Medicare Other | Admitting: Diagnostic Neuroimaging

## 2017-12-30 ENCOUNTER — Encounter (INDEPENDENT_AMBULATORY_CARE_PROVIDER_SITE_OTHER): Payer: Self-pay

## 2017-12-30 ENCOUNTER — Encounter: Payer: Self-pay | Admitting: Diagnostic Neuroimaging

## 2017-12-30 VITALS — BP 134/82 | HR 98 | Ht 63.0 in | Wt 148.2 lb

## 2017-12-30 DIAGNOSIS — F03B0 Unspecified dementia, moderate, without behavioral disturbance, psychotic disturbance, mood disturbance, and anxiety: Secondary | ICD-10-CM

## 2017-12-30 DIAGNOSIS — F0390 Unspecified dementia without behavioral disturbance: Secondary | ICD-10-CM

## 2017-12-30 DIAGNOSIS — R269 Unspecified abnormalities of gait and mobility: Secondary | ICD-10-CM

## 2017-12-30 DIAGNOSIS — I6930 Unspecified sequelae of cerebral infarction: Secondary | ICD-10-CM | POA: Diagnosis not present

## 2017-12-30 DIAGNOSIS — J449 Chronic obstructive pulmonary disease, unspecified: Secondary | ICD-10-CM

## 2017-12-30 NOTE — Progress Notes (Signed)
[facility]  PATIENT: Anita Madden DOB: 10-19-1934  REFERRING CLINICIAN: Polite, Latrice HISTORY FROM: patient, son, daughter-in-law REASON FOR VISIT: new consult    HISTORICAL  CHIEF COMPLAINT:  Chief Complaint  Patient presents with  . Dementia    rm 6, New Pt, lives at [facility], son- Audelia [place], dgtr in [employer], MMSE 18    HISTORY OF PRESENT ILLNESS:   83 year old female here for evaluation of dementia.    Patient has history of stroke in 2000 with left-sided vision loss (right PCA stroke).  Patient also had left carotid endarterectomy around that time.  No further stroke symptoms.  Patient is on aspirin, statin, blood pressure control.  Patient also developed mild short-term memory loss starting in 2016.  She was having more difficulty living at home, with COPD and gait difficulty.  Due to progression of physical and cognitive symptoms patient moved to [facility] assisted living in October 2018.  Patient has made a good transition.  Initially she was quite active there but over the last couple of months she has become less active, more gait difficulty and more confusion.  Patient using donepezil for dementia.  Patient has fairly good insight but she has some mild memory problems.  MMSE today is 18 out of 30.  No behavioral problems.   REVIEW OF SYSTEMS: Full 14 system review of systems performed and negative with exception of: Memory loss confusion weakness difficulty swallowing depression anxiety decreased energy allergies shortness of breath wheezing.  ALLERGIES: Allergies  Allergen Reactions  . Penicillins Hives and Swelling    HOME MEDICATIONS: Outpatient Medications Prior to Visit  Medication Sig Dispense Refill  . acetaminophen (TYLENOL) 650 MG CR tablet Take 650 mg by mouth every 8 (eight) hours as needed for pain.    Marland Kitchen aspirin 81 MG tablet Take 81 mg by mouth daily.    Marland Kitchen atorvastatin (LIPITOR) 10 MG tablet 10 mg daily.    .  budesonide-formoterol (SYMBICORT) 160-4.5 MCG/ACT inhaler Take 2 puffs first thing in am and then another 2 puffs about 12 hours later. 1 Inhaler 12  . donepezil (ARICEPT) 10 MG tablet 10 mg daily.    Marland Kitchen guaiFENesin-dextromethorphan (ROBITUSSIN DM) 100-10 MG/5ML syrup Take 5 mLs by mouth every 4 (four) hours as needed for cough.    Marland Kitchen ipratropium-albuterol (DUONEB) 0.5-2.5 (3) MG/3ML SOLN Take 3 mLs by nebulization 4 (four) times daily.    Marland Kitchen LORazepam (ATIVAN) 0.5 MG tablet Take 0.5 mg by mouth 2 (two) times daily.    . mirtazapine (REMERON) 30 MG tablet 30 mg at bedtime.    . Multiple Vitamins-Minerals (MULTIVITAL) tablet Take 1 tablet by mouth daily.    Marland Kitchen omeprazole (PRILOSEC) 20 MG capsule Take 20 mg by mouth daily.     . OXYGEN 2 lpm with sleep  [facility]    . predniSONE (DELTASONE) 10 MG tablet 10 mg 2 (two) times daily.    . traMADol (ULTRAM) 50 MG tablet Take 50 mg by mouth 2 (two) times daily as needed.    . triamterene-hydrochlorothiazide (MAXZIDE-25) 37.5-25 MG tablet Take 1 tablet by mouth daily.    . verapamil (CALAN) 40 MG tablet Take 40 mg by mouth 3 (three) times daily. Patient taking one daily    . calcium-vitamin D (OSCAL WITH D) 500-200 MG-UNIT tablet Take 1 tablet by mouth 2 (two) times daily.    . cholecalciferol (VITAMIN D) 1000 UNITS tablet Take 1,000 Units by mouth daily.    . citalopram (CELEXA) 10 MG  tablet Take 10 mg by mouth daily.    Marland Kitchen docusate sodium (COLACE) 100 MG capsule Take 100 mg by mouth 2 (two) times daily.    Marland Kitchen Respiratory Therapy Supplies (FLUTTER) DEVI Use as directed (Patient not taking: Reported on 12/30/2017) 1 each 0  . atorvastatin (LIPITOR) 80 MG tablet Take 80 mg by mouth daily.     No facility-administered medications prior to visit.     PAST MEDICAL HISTORY: Past Medical History:  Diagnosis Date  . Anxiety   . COPD (chronic obstructive pulmonary disease) (HCC)   . Dementia   . Hyperlipidemia   . Hypertension   . Stroke [facility]) 2000    PAST  SURGICAL HISTORY: Past Surgical History:  Procedure Laterality Date  . APPENDECTOMY  1976  . VESICOVAGINAL FISTULA CLOSURE W/ TAH  1976    FAMILY HISTORY: Family History  Problem Relation Age of Onset  . Emphysema Father        smoked  . Heart disease Mother   . Cervical cancer Mother   . Bladder Cancer Brother     SOCIAL HISTORY:  Social History   Socioeconomic History  . Marital status: Widowed    Spouse name: Not on file  . Number of children: Not on file  . Years of education: Not on file  . Highest education level: Not on file  Occupational History  . Not on file  Social Needs  . Financial resource strain: Not on file  . Food insecurity:    Worry: Not on file    Inability: Not on file  . Transportation needs:    Medical: Not on file    Non-medical: Not on file  Tobacco Use  . Smoking status: Former Smoker    Packs/day: 1.00    Years: 63.00    Pack years: 63.00    Types: Cigarettes    Last attempt to quit: 06/16/2015    Years since quitting: 2.5  . Smokeless tobacco: Never Used  Substance and Sexual Activity  . Alcohol use: Not Currently    Alcohol/week: 0.0 oz    Comment: quit 2000  . Drug use: Never  . Sexual activity: Not on file  Lifestyle  . Physical activity:    Days per week: Not on file    Minutes per session: Not on file  . Stress: Not on file  Relationships  . Social connections:    Talks on phone: Not on file    Gets together: Not on file    Attends religious service: Not on file    Active member of club or organization: Not on file    Attends meetings of clubs or organizations: Not on file    Relationship status: Not on file  . Intimate partner violence:    Fear of current or ex partner: Not on file    Emotionally abused: Not on file    Physically abused: Not on file    Forced sexual activity: Not on file  Other Topics Concern  . Not on file  Social History Narrative   Lives at [facility] since Oct 2018    Education- 10   Children- 2     PHYSICAL EXAM  GENERAL EXAM/CONSTITUTIONAL: Vitals:  Vitals:   12/30/17 1446  BP: 134/82  Pulse: 98  Weight: 148 lb 3.2 oz (67.2 kg)  Height: 5\' 3"  (1.6 m)   Body mass index is 26.25 kg/m. No exam data present  Patient is in no distress; well developed, nourished and  groomed; neck is supple  CARDIOVASCULAR:  Examination of carotid arteries is normal; no carotid bruits  Regular rate and rhythm, no murmurs  Examination of peripheral vascular system by observation and palpation is normal  WHEEZING  EYES:  Ophthalmoscopic exam of optic discs and posterior segments is normal; no papilledema or hemorrhages  MUSCULOSKELETAL:  Gait, strength, tone, movements noted in Neurologic exam below  NEUROLOGIC: MENTAL STATUS:  MMSE - Mini Mental State Exam 12/30/2017  Orientation to time 2  Orientation to Place 2  Registration 3  Attention/ Calculation 1  Recall 2  Language- name 2 objects 2  Language- repeat 1  Language- follow 3 step command 3  Language- read & follow direction 1  Write a sentence 1  Copy design 0  Total score 18    awake, alert, oriented to person, place and time  recent and remote memory intact  normal attention and concentration  language fluent, comprehension intact, naming intact,   fund of knowledge appropriate  CRANIAL NERVE:   2nd - no papilledema on fundoscopic exam  2nd, 3rd, 4th, 6th - pupils equal and reactive to light, visual fields --> DECR IN LEFT VISUAL FIELD, extraocular muscles intact, no nystagmus  5th - facial sensation symmetric  7th - facial strength symmetric  8th - hearing intact  9th - palate elevates symmetrically, uvula midline  11th - shoulder shrug symmetric  12th - tongue protrusion midline  MOTOR:   POSTURAL TREMOR IN BUE  normal bulk and tone, full strength in the BUE, BLE  SENSORY:   normal and symmetric to light touch, temperature, vibration  COORDINATION:     finger-nose-finger, fine finger movements normal  REFLEXES:   deep tendon reflexes TRACE and symmetric  GAIT/STATION:   narrow based gait    DIAGNOSTIC DATA (LABS, IMAGING, TESTING) - I reviewed patient records, labs, notes, testing and imaging myself where available.  Lab Results  Component Value Date   WBC 11.4 (H) 07/11/2015   HGB 13.3 07/11/2015   HCT 40.7 07/11/2015   MCV 87.0 07/11/2015   PLT 273.0 07/11/2015      Component Value Date/Time   NA 133 (L) 07/11/2015 1143   K 4.2 07/11/2015 1143   CL 94 (L) 07/11/2015 1143   CO2 30 07/11/2015 1143   GLUCOSE 94 07/11/2015 1143   BUN 16 07/11/2015 1143   CREATININE 0.97 07/11/2015 1143   CALCIUM 9.4 07/11/2015 1143   No results found for: CHOL, HDL, LDLCALC, LDLDIRECT, TRIG, CHOLHDL No results found for: ZOXW9UHGBA1C No results found for: VITAMINB12 Lab Results  Component Value Date   TSH 0.83 07/11/2015    Jan 2016 CT head [I reviewed images myself and agree with interpretation. -VRP]  - chronic right PCA infarction    ASSESSMENT AND PLAN  83 y.o. year old female here with:  Dx:  1. Moderate dementia without behavioral disturbance   2. Chronic ischemic right PCA stroke   3. Chronic obstructive pulmonary disease, unspecified COPD type (HCC)   4. Gait difficulty      PLAN:  DEMENTIA - continue donepezil  GAIT DIFFICULTY (due to dementia, COPD, deconditioning) - use walker - trial of physical therapy  Return if symptoms worsen or fail to improve, for return to PCP.    Suanne MarkerVIKRAM R. Brityn Mastrogiovanni, MD 12/30/2017, 3:08 PM Certified in Neurology, Neurophysiology and Neuroimaging  [facility] [address], [address] [place], [place] [address] [phone]

## 2018-01-27 ENCOUNTER — Encounter: Payer: Self-pay | Admitting: Internal Medicine

## 2018-01-27 ENCOUNTER — Ambulatory Visit (INDEPENDENT_AMBULATORY_CARE_PROVIDER_SITE_OTHER): Payer: Medicare Other | Admitting: Internal Medicine

## 2018-01-27 ENCOUNTER — Ambulatory Visit (INDEPENDENT_AMBULATORY_CARE_PROVIDER_SITE_OTHER)
Admission: RE | Admit: 2018-01-27 | Discharge: 2018-01-27 | Disposition: A | Discharge status: Home / Self Care | Payer: Medicare Other | Source: Ambulatory Visit | Attending: Internal Medicine | Admitting: Internal Medicine

## 2018-01-27 ENCOUNTER — Other Ambulatory Visit (INDEPENDENT_AMBULATORY_CARE_PROVIDER_SITE_OTHER): Payer: Medicare Other

## 2018-01-27 VITALS — BP 124/70 | HR 105 | Ht 63.0 in | Wt 145.2 lb

## 2018-01-27 DIAGNOSIS — J9611 Chronic respiratory failure with hypoxia: Secondary | ICD-10-CM

## 2018-01-27 DIAGNOSIS — R911 Solitary pulmonary nodule: Secondary | ICD-10-CM | POA: Diagnosis not present

## 2018-01-27 DIAGNOSIS — R0609 Other forms of dyspnea: Secondary | ICD-10-CM | POA: Diagnosis not present

## 2018-01-27 DIAGNOSIS — J449 Chronic obstructive pulmonary disease, unspecified: Secondary | ICD-10-CM | POA: Diagnosis not present

## 2018-01-27 LAB — CBC WITH DIFFERENTIAL/PLATELET
BASOS ABS: 0.1 10*3/uL (ref 0.0–0.1)
Basophils Relative: 0.6 % (ref 0.0–3.0)
EOS ABS: 0 10*3/uL (ref 0.0–0.7)
Eosinophils Relative: 0.1 % (ref 0.0–5.0)
HCT: 38.7 % (ref 36.0–46.0)
HEMOGLOBIN: 12.9 g/dL (ref 12.0–15.0)
Lymphocytes Relative: 23.5 % (ref 12.0–46.0)
Lymphs Abs: 2.9 10*3/uL (ref 0.7–4.0)
MCHC: 33.2 g/dL (ref 30.0–36.0)
MCV: 92.1 fl (ref 78.0–100.0)
MONO ABS: 0.6 10*3/uL (ref 0.1–1.0)
Monocytes Relative: 4.7 % (ref 3.0–12.0)
Neutro Abs: 8.9 10*3/uL — ABNORMAL HIGH (ref 1.4–7.7)
Neutrophils Relative %: 71.1 % (ref 43.0–77.0)
Platelets: 277 10*3/uL (ref 150.0–400.0)
RBC: 4.2 Mil/uL (ref 3.87–5.11)
RDW: 17 % — ABNORMAL HIGH (ref 11.5–15.5)
WBC: 12.5 10*3/uL — AB (ref 4.0–10.5)

## 2018-01-27 LAB — BASIC METABOLIC PANEL
BUN: 37 mg/dL — ABNORMAL HIGH (ref 6–23)
CO2: 32 mEq/L (ref 19–32)
CREATININE: 1.57 mg/dL — AB (ref 0.40–1.20)
Calcium: 9.4 mg/dL (ref 8.4–10.5)
Chloride: 102 mEq/L (ref 96–112)
GFR: 33.64 mL/min — AB (ref >60.00)
Glucose, Bld: 124 mg/dL — ABNORMAL HIGH (ref 70–99)
Potassium: 4.3 mEq/L (ref 3.5–5.1)
Sodium: 142 mEq/L (ref 135–145)

## 2018-01-27 LAB — BRAIN NATRIURETIC PEPTIDE: Pro B Natriuretic peptide (BNP): 78 pg/mL (ref 0.0–100.0)

## 2018-01-27 MED ORDER — BUDESONIDE 0.25 MG/2ML IN SUSP: RESPIRATORY_TRACT | 12 refills | Status: AC

## 2018-01-27 MED ORDER — FORMOTEROL FUMARATE 20 MCG/2ML IN NEBU: 20.0000 ug | INHALATION_SOLUTION | Freq: Two times a day (BID) | RESPIRATORY_TRACT | 11 refills | Status: AC

## 2018-01-27 NOTE — Progress Notes (Signed)
Subjective:    Patient ID: Anita Madden, female    DOB: 08-Oct-1934    MRN: [id number]    Brief patient profile:  82 yowf quit smoking at admit to [facility] in 06/2015 > aecopd > to rehab and d/c 07/06/15 back home newly on 02 2lpm 24/7 and referred to pulmonary clinic by Dr Sherril Croon 07/11/2015 proved to have GOLD II criteria 10/12/2015    History of Present Illness  07/11/2015 1st [facility] office visit/ Anita Madden   Chief Complaint  Patient presents with  . Pulmonary Consult    Referred by Dr. Doreen Beam. Pt states she was dxed with COPD approx 1 yr ago. Her SOB and cough has been worse for the past 2 months. She states that she has alot of trouble with her breathing first thing in the am. Her cough is prod with minimal clear/white sputum. She is using Duoneb 3-4 x per day.    baseline prior to aecopd = shopping at a mall ok / main problem was carrying groceries in from car to kitchen on advair and occ proair but now duoneb ? 8 x daily / confused with details of care per fm and sob x 10 ft despite 02  rec Prednisone 10 mg take  4 each am x 2 days,   2 each am x 2 days,  1 each am x 2 days and stop  duoneb is four times daily  Only use your albuterol (Proair)  Work on inhaler technique:   Omeprazole Take 30- 60 min before your first and last meals of the day and Add pepcid 20 mg at bedtime  GERD diet When cough use the flutter valve as much as possible     07/25/2015  f/u ov/Anita Madden re: copd/ ? Still 02 dep Chief Complaint  Patient presents with  . Follow-up    Pt following for COPD: pt states she is doing ok. pt states she was much better on the prednisone, activity, breathing and mentally. pt c/o some SOB, and prod cough clear in color. no c/o chest tightness. pt in continuous O2 at 2LPM.  night and day difference on prednisone and losing ground since she initiated her short course with increasing dyspnea and cough along with chest tightness that does respond to albuterol  despite poor HFA technique. rec Start symbicort 160 Take 2 puffs first thing in am and then another 2 puffs about 12 hours later.  Plan B  - Only use your albuterol as a rescue medication   Plan C  - duoneb if you try plan B first and it doesn't work  Prednisone 10 mg  4 for two days three for two days two for two days one for two days  Prevnar today    10/12/2015  f/u ov/Anita Madden re:  GOLD II copd  Chief Complaint  Patient presents with  . Follow-up    Pt states that her breathing is doing well. She is only really using o2 with sleep. She uses neb 2-3 x per day.   only use 02 hs   On symbiocort 160 2bid plus freq saba but overall much better  rec Plan A = automatic >>> Start symbicort 160 Take 2 puffs first thing in am and then another 2 puffs about 12 hours later.  Plan B  - Only use your albuterol (Proair) as a rescue  Plan C  - duoneb if you try plan B first and it doesn't work    Oct  2018 admitted [facility] > 24 h 02     01/27/2018  f/u ov/Anita Madden re:  Re establish for COPD GOLD II/ now 02 dep 24/7  @ 2lpm  Chief Complaint  Patient presents with  . Follow-up    COPD- breathing worse at night, 2L O2 pulsed , no chest tightness or cough, recent facial swelling   Dyspnea:  Says can walk to cafeteria over over 100 ft but gets tired > sob as long as on 02  Cough: none Sleep: able to lie flat on 2lpm and one pillow  SABA use:  At least twice daily    No obvious day to day or daytime variability or assoc excess/ purulent sputum or mucus plugs or hemoptysis or cp or chest tightness, subjective wheeze or overt sinus or hb symptoms. No unusual exposure hx or h/o childhood pna/ asthma or knowledge of premature birth.  Sleeping  2lpm/ one pillow   without nocturnal  or early am exacerbation  of respiratory  c/o's or need for noct saba. Also denies any obvious fluctuation of symptoms with weather or environmental changes or other aggravating or alleviating factors except as outlined above     Current Allergies, Complete Past Medical History, Past Surgical History, Family History, and Social History were reviewed in [employer] record.  ROS  The following are not active complaints unless bolded Hoarseness, sore throat, dysphagia, dental problems, itching, sneezing,  nasal congestion or discharge of excess mucus or purulent secretions, ear ache,   fever, chills, sweats, unintended wt loss or wt gain, classically pleuritic or exertional cp,  orthopnea pnd or arm/hand swelling  or leg swelling, presyncope, palpitations, abdominal pain, anorexia, nausea, vomiting, diarrhea  or change in bowel habits or change in bladder habits, change in stools or change in urine, dysuria, hematuria,  rash, arthralgias, visual complaints, headache, numbness, weakness or ataxia or problems with walking or coordination,  change in mood or  memory.        Current Meds  Medication Sig  . acetaminophen (TYLENOL) 650 MG CR tablet Take 650 mg by mouth every 8 (eight) hours as needed for pain.  Marland Kitchen aspirin 81 MG tablet Take 81 mg by mouth daily.  Marland Kitchen atorvastatin (LIPITOR) 10 MG tablet 10 mg daily.  . budesonide-formoterol (SYMBICORT) 160-4.5 MCG/ACT inhaler Take 2 puffs first thing in am and then another 2 puffs about 12 hours later.  . cholecalciferol (VITAMIN D) 1000 UNITS tablet Take 1,000 Units by mouth daily.  . citalopram (CELEXA) 10 MG tablet Take 10 mg by mouth daily.  Marland Kitchen donepezil (ARICEPT) 10 MG tablet 10 mg daily.  Marland Kitchen ipratropium-albuterol (DUONEB) 0.5-2.5 (3) MG/3ML SOLN Take 3 mLs by nebulization 4 (four) times daily.  Marland Kitchen LORazepam (ATIVAN) 0.5 MG tablet Take 0.5 mg by mouth 2 (two) times daily.  . mirtazapine (REMERON) 30 MG tablet 30 mg at bedtime.  . Multiple Vitamins-Minerals (MULTIVITAL) tablet Take 1 tablet by mouth daily.  Marland Kitchen omeprazole (PRILOSEC) 20 MG capsule Take 20 mg by mouth daily.   . OXYGEN 2 lpm all the time [facility]  . triamterene-hydrochlorothiazide (MAXZIDE-25)  37.5-25 MG tablet Take 1 tablet by mouth daily.  . verapamil (CALAN) 40 MG tablet Take 40 mg by mouth 3 (three) times daily. Patient taking one daily                     Objective:   Physical Exam     W/c bound slt cushingnoid wf nad  01/27/2018       145   10/12/15 132 lb (59.875 kg)  07/25/15 131 lb (59.421 kg)  07/11/15 134 lb (60.782 kg)    Vital signs reviewed - Note on arrival 02 sats  97% on 2lpm POC            HEENT: nl dentition / oropharynx. Nl external ear canals without cough reflex - mild bilateral non-specific turbinate edema     NECK :  without JVD/Nodes/TM/ nl carotid upstrokes bilaterally   LUNGS: no acc muscle use,  Mild kyphotic/  barrel  contour chest wall with bilateral  Distant bs s audible wheeze and  without cough on insp or exp maneuver and mild  Hyperresonant  to  percussion bilaterally     CV:  RRR  no s3 or murmur or increase in P2, and no edema   ABD:  soft and nontender . No bruits or organomegaly appreciated, bowel sounds nl  MS:   Slow stooped over  Gait leaning on w/c /  ext warm without deformities, calf tenderness, cyanosis or clubbing No obvious joint restrictions   SKIN: warm and dry without lesions    NEURO:  alert, but poor short term memory  with  no motor or cerebellar deficits apparent.             CXR PA and Lateral:   01/27/2018 :    I personally reviewed images and agree with radiology impression as follows:    Right middle lobe lung nodule. CT is recommended. Malignancy is not excluded.  Cardiomegaly.   Labs ordered/ reviewed:      Chemistry      Component Value Date/Time   NA 142 01/27/2018 1555   K 4.3 01/27/2018 1555   CL 102 01/27/2018 1555   CO2 32 01/27/2018 1555   BUN 37 (H) 01/27/2018 1555   CREATININE 1.57 (H) 01/27/2018 1555      Component Value Date/Time   CALCIUM 9.4 01/27/2018 1555        Lab Results  Component Value Date   WBC 12.5 (H) 01/27/2018   HGB 12.9 01/27/2018     HCT 38.7 01/27/2018   MCV 92.1 01/27/2018   PLT 277.0 01/27/2018       EOS                                                              0.1                                     01/27/2018     Lab Results  Component Value Date   TSH 0.57 01/27/2018     Lab Results  Component Value Date   PROBNP 78.0 01/27/2018              Assessment & Plan:

## 2018-01-27 NOTE — Patient Instructions (Addendum)
Plan A = Automatic = stop symbicort and start performist 20 mcg  with pulmocort 0.25 mg daily twice daily   Reduce prednisone to 10 mg daily x one week then 5 mg daily thereafter and taper off form there if tolerated by your PCP     Plan B = Backup Only use  duoneb  if you can't catch your breath by resting or doing a relaxed purse lip breathing pattern.  - The less you use it, the better it will work when you need it. - Ok to use the inhaler up to  every 4 hours if you must but call for appointment if use goes up over your usual need      Please remember to go to the  Lab /x-ray department downstairs in the basement  for your tests - we will call you with the results when they are available.   Pulmonary follow up is as needed

## 2018-01-27 NOTE — Assessment & Plan Note (Addendum)
-   Flutter added 07/11/15   - 10/12/2015  extensive coaching HFA effectiveness =   75%  - PFT's  10/12/2015  FEV1 1.13 (59 % ) ratio 59  p % improvement from saba with DLCO  41 % corrects to 62 % for alv volume    - 01/27/2018 changed to neb laba/ics due to systemic steroid dep since 09/2017 > taper off if tol p start ics/laba per neb    DDX of  difficult airways management almost all start with A and  include Adherence, Ace Inhibitors, Acid Reflux, Active Sinus Disease, Alpha 1 Antitripsin deficiency, Anxiety masquerading as Airways dz,  ABPA,  Allergy(esp in young), Aspiration (esp in elderly), Adverse effects of meds,  Active smokers, A bunch of PE's (a small clot burden can't cause this syndrome unless there is already severe underlying pulm or vascular dz with poor reserve) plus two Bs  = Bronchiectasis and Beta blocker use..and one C= CHF   Adherence is always the initial "prime suspect" and is a multilayered concern that requires a "trust but verify" approach in every patient - starting with knowing how to use medications, especially inhalers, correctly, keeping up with refills and understanding the fundamental difference between maintenance and prns vs those medications only taken for a very short course and then stopped and not refilled.  - does not appear she can use symbicort effectively at this point   ? Acid (or non-acid) GERD > always difficult to exclude as up to 75% of pts in some series report no assoc GI/ Heartburn symptoms> rec continue max (24h)  acid suppression   ? Anxiety/depression/ cognitive decline / deconditioning  > usually at the bottom of this list of usual suspects but should be much higher on this pt's based on H and P and note already on psychotropics and may interfere with adherence and also interpretation of response or lack thereof to symptom management which can be quite subjective.    ? Allergy/asthma > slowly taper prednisone off if tolerates once the laba/ICS neb  has a chance to work (a week minimum between changes)     ? chf > excluded with bnp so low    I had an extended discussion with the patient reviewing all relevant studies completed to date and  lasting 25 minutes of a 40  minute office  visit to re-establish     re  severe non-specific but potentially very serious refractory respiratory symptoms of uncertain and potentially multiple  etiologies.   Each maintenance medication was reviewed in detail including most importantly the difference between maintenance and prns and under what circumstances the prns are to be triggered using an action plan format that is not reflected in the computer generated alphabetically organized AVS.    Please see AVS for specific instructions unique to this office visit that I personally wrote and verbalized to the the pt in detail and then reviewed with pt  by my nurse highlighting any changes in therapy/plan of care  recommended at today's visit.

## 2018-01-28 ENCOUNTER — Encounter: Payer: Self-pay | Admitting: Internal Medicine

## 2018-01-28 ENCOUNTER — Telehealth: Payer: Self-pay | Admitting: Internal Medicine

## 2018-01-28 DIAGNOSIS — R911 Solitary pulmonary nodule: Secondary | ICD-10-CM

## 2018-01-28 LAB — TSH: TSH: 0.57 u[IU]/mL (ref 0.35–4.50)

## 2018-01-28 NOTE — Telephone Encounter (Signed)
Notes recorded by Nyoka Cowden, MD on 01/28/2018 at 9:07 AM EDT Call patient : Studies are c/w vol depletion rec d/c hctz and recheck bmet in one week at SNF  Notes recorded by Nyoka Cowden, MD on 01/28/2018 at 9:13 AM EDT Call pt: Reviewed cxr may have vague nodule on R vs overlapping ribs - since her condition will not allow intervention at this point favor f/u in 3 months with cxr as if this is significant we should be able to track it with just a plain cxr and regroup then.  ----------  lmtcb for pt.

## 2018-01-28 NOTE — Assessment & Plan Note (Addendum)
D/cd on 02 2lpm 06/2015 from [facility] - 07/25/2015  Walked RA x 3 laps @ 185 ft each stopped due to  End of study, slow pace, no sob or desat  > rec hs 02 only   - 01/27/2018   Walked 2lpm  x one lap @ 185 stopped due to  Fatigu/ not sob/ slow pace    02 dep 24/7 @ 2lpm for now

## 2018-01-28 NOTE — Assessment & Plan Note (Signed)
See cxr 01/28/2018 > not candidate for any intervention so rec ov/ cxr 04/30/18 (in reminder file)

## 2018-01-28 NOTE — Telephone Encounter (Signed)
Pt is calling back [phone]

## 2018-01-28 NOTE — Telephone Encounter (Signed)
Called and spoke with pt's daughter-n-law Audelia Acton letting her know the results of pt's cxr.  Stated to her we will have pt follow up in 3 months and then repeat cxr so we could compare it to the one that was just done.  Benny expressed understanding. Pt's 61-month appt has been scheduled and order has been placed for cxr.  Nothing further needed at this time.

## 2018-04-15 ENCOUNTER — Other Ambulatory Visit: Payer: Self-pay

## 2018-04-15 ENCOUNTER — Emergency Department (HOSPITAL_COMMUNITY): Payer: Medicare Other

## 2018-04-15 ENCOUNTER — Encounter (HOSPITAL_COMMUNITY): Payer: Self-pay | Admitting: Emergency Medicine

## 2018-04-15 ENCOUNTER — Observation Stay (HOSPITAL_COMMUNITY)
Admission: EM | Admit: 2018-04-15 | Discharge: 2018-04-16 | Disposition: A | Discharge status: Nursing Home (Includes ICF) | Payer: Medicare Other | Attending: Internal Medicine | Admitting: Internal Medicine

## 2018-04-15 DIAGNOSIS — J441 Chronic obstructive pulmonary disease with (acute) exacerbation: Principal | ICD-10-CM | POA: Insufficient documentation

## 2018-04-15 DIAGNOSIS — Z79899 Other long term (current) drug therapy: Secondary | ICD-10-CM | POA: Insufficient documentation

## 2018-04-15 DIAGNOSIS — Z66 Do not resuscitate: Secondary | ICD-10-CM | POA: Insufficient documentation

## 2018-04-15 DIAGNOSIS — E785 Hyperlipidemia, unspecified: Secondary | ICD-10-CM | POA: Diagnosis not present

## 2018-04-15 DIAGNOSIS — Z7982 Long term (current) use of aspirin: Secondary | ICD-10-CM | POA: Insufficient documentation

## 2018-04-15 DIAGNOSIS — R000 Tachycardia, unspecified: Secondary | ICD-10-CM | POA: Insufficient documentation

## 2018-04-15 DIAGNOSIS — F419 Anxiety disorder, unspecified: Secondary | ICD-10-CM | POA: Insufficient documentation

## 2018-04-15 DIAGNOSIS — Z87891 Personal history of nicotine dependence: Secondary | ICD-10-CM | POA: Diagnosis not present

## 2018-04-15 DIAGNOSIS — K219 Gastro-esophageal reflux disease without esophagitis: Secondary | ICD-10-CM | POA: Diagnosis not present

## 2018-04-15 DIAGNOSIS — J9621 Acute and chronic respiratory failure with hypoxia: Secondary | ICD-10-CM | POA: Diagnosis not present

## 2018-04-15 DIAGNOSIS — Z9981 Dependence on supplemental oxygen: Secondary | ICD-10-CM | POA: Diagnosis not present

## 2018-04-15 DIAGNOSIS — I10 Essential (primary) hypertension: Secondary | ICD-10-CM | POA: Insufficient documentation

## 2018-04-15 DIAGNOSIS — D649 Anemia, unspecified: Secondary | ICD-10-CM

## 2018-04-15 DIAGNOSIS — I498 Other specified cardiac arrhythmias: Secondary | ICD-10-CM | POA: Insufficient documentation

## 2018-04-15 DIAGNOSIS — Z7952 Long term (current) use of systemic steroids: Secondary | ICD-10-CM | POA: Insufficient documentation

## 2018-04-15 DIAGNOSIS — Z7951 Long term (current) use of inhaled steroids: Secondary | ICD-10-CM | POA: Diagnosis not present

## 2018-04-15 DIAGNOSIS — R911 Solitary pulmonary nodule: Secondary | ICD-10-CM | POA: Diagnosis not present

## 2018-04-15 DIAGNOSIS — L89512 Pressure ulcer of right ankle, stage 2: Secondary | ICD-10-CM | POA: Insufficient documentation

## 2018-04-15 DIAGNOSIS — L8990 Pressure ulcer of unspecified site, unspecified stage: Secondary | ICD-10-CM

## 2018-04-15 DIAGNOSIS — Z880 Allergy status to penicillin: Secondary | ICD-10-CM | POA: Insufficient documentation

## 2018-04-15 DIAGNOSIS — Z8673 Personal history of transient ischemic attack (TIA), and cerebral infarction without residual deficits: Secondary | ICD-10-CM | POA: Diagnosis not present

## 2018-04-15 DIAGNOSIS — F0390 Unspecified dementia without behavioral disturbance: Secondary | ICD-10-CM | POA: Diagnosis not present

## 2018-04-15 DIAGNOSIS — R0902 Hypoxemia: Secondary | ICD-10-CM

## 2018-04-15 LAB — CBC WITH DIFFERENTIAL/PLATELET
BASOS ABS: 0.1 10*3/uL (ref 0.0–0.1)
BASOS PCT: 1 %
EOS ABS: 0 10*3/uL (ref 0.0–0.7)
Eosinophils Relative: 0 %
HEMATOCRIT: 34.7 % — AB (ref 36.0–46.0)
HEMOGLOBIN: 10.8 g/dL — AB (ref 12.0–15.0)
LYMPHS ABS: 1.8 10*3/uL (ref 0.7–4.0)
LYMPHS PCT: 16 %
MCH: 27.9 pg (ref 26.0–34.0)
MCHC: 31.1 g/dL (ref 30.0–36.0)
MCV: 89.7 fL (ref 78.0–100.0)
MONOS PCT: 4 %
Monocytes Absolute: 0.4 10*3/uL (ref 0.1–1.0)
NEUTROS ABS: 8.8 10*3/uL — AB (ref 1.7–7.7)
Neutrophils Relative %: 79 %
Platelets: 285 10*3/uL (ref 150–400)
RBC: 3.87 MIL/uL (ref 3.87–5.11)
RDW: 14.7 % (ref 11.5–15.5)
WBC Morphology: INCREASED
WBC: 11.1 10*3/uL — ABNORMAL HIGH (ref 4.0–10.5)

## 2018-04-15 LAB — BASIC METABOLIC PANEL
Anion gap: 13 (ref 5–15)
BUN: 46 mg/dL — AB (ref 8–23)
CALCIUM: 8.9 mg/dL (ref 8.9–10.3)
CO2: 25 mmol/L (ref 22–32)
Chloride: 96 mmol/L — ABNORMAL LOW (ref 98–111)
Creatinine, Ser: 1.12 mg/dL — ABNORMAL HIGH (ref 0.44–1.00)
GFR calc Af Amer: 52 mL/min — ABNORMAL LOW (ref >60)
GFR, EST NON AFRICAN AMERICAN: 45 mL/min — AB (ref >60)
GLUCOSE: 125 mg/dL — AB (ref 70–99)
Potassium: 4 mmol/L (ref 3.5–5.1)
Sodium: 134 mmol/L — ABNORMAL LOW (ref 135–145)

## 2018-04-15 LAB — I-STAT TROPONIN, ED: Troponin i, poc: 0.05 ng/mL (ref 0.00–0.08)

## 2018-04-15 LAB — MRSA PCR SCREENING: MRSA by PCR: NEGATIVE

## 2018-04-15 LAB — GLUCOSE, CAPILLARY: GLUCOSE-CAPILLARY: 127 mg/dL — AB (ref 70–99)

## 2018-04-15 LAB — TROPONIN I: TROPONIN I: 0.07 ng/mL — AB (ref <0.03)

## 2018-04-15 MED ORDER — ENOXAPARIN SODIUM 40 MG/0.4ML ~~LOC~~ SOLN
40.0000 mg | SUBCUTANEOUS | Status: DC
  Administered 2018-04-15 – 2018-04-16 (×2): 40 mg via SUBCUTANEOUS
  Filled 2018-04-15 (×2): qty 0.4

## 2018-04-15 MED ORDER — GI COCKTAIL ~~LOC~~
30.0000 mL | Freq: Once | ORAL | Status: AC
  Administered 2018-04-15: 30 mL via ORAL
  Filled 2018-04-15: qty 30

## 2018-04-15 MED ORDER — VERAPAMIL HCL 40 MG PO TABS: 40.0000 mg | ORAL_TABLET | Freq: Three times a day (TID) | ORAL | Status: DC

## 2018-04-15 MED ORDER — IPRATROPIUM-ALBUTEROL 0.5-2.5 (3) MG/3ML IN SOLN
3.0000 mL | Freq: Three times a day (TID) | RESPIRATORY_TRACT | Status: DC
  Administered 2018-04-16 (×3): 3 mL via RESPIRATORY_TRACT
  Filled 2018-04-15 (×3): qty 3

## 2018-04-15 MED ORDER — IPRATROPIUM-ALBUTEROL 0.5-2.5 (3) MG/3ML IN SOLN
3.0000 mL | Freq: Once | RESPIRATORY_TRACT | Status: AC
  Administered 2018-04-15: 3 mL via RESPIRATORY_TRACT
  Filled 2018-04-15: qty 3

## 2018-04-15 MED ORDER — ALUM & MAG HYDROXIDE-SIMETH 200-200-20 MG/5ML PO SUSP
30.0000 mL | ORAL | Status: DC | PRN
  Administered 2018-04-15: 30 mL via ORAL
  Filled 2018-04-15: qty 30

## 2018-04-15 MED ORDER — ENSURE PO LIQD: 237.0000 mL | Freq: Three times a day (TID) | ORAL | Status: DC

## 2018-04-15 MED ORDER — DOCUSATE SODIUM 100 MG PO CAPS: 100.0000 mg | ORAL_CAPSULE | Freq: Every day | ORAL | Status: DC | PRN

## 2018-04-15 MED ORDER — ENSURE ENLIVE PO LIQD
237.0000 mL | Freq: Three times a day (TID) | ORAL | Status: DC
  Administered 2018-04-15 – 2018-04-16 (×3): 237 mL via ORAL
  Filled 2018-04-15: qty 237

## 2018-04-15 MED ORDER — VERAPAMIL HCL 40 MG PO TABS
40.0000 mg | ORAL_TABLET | Freq: Every day | ORAL | Status: DC
  Administered 2018-04-15 – 2018-04-16 (×2): 40 mg via ORAL
  Filled 2018-04-15 (×2): qty 1

## 2018-04-15 MED ORDER — METOPROLOL TARTRATE 5 MG/5ML IV SOLN
5.0000 mg | INTRAVENOUS | Status: AC | PRN
  Administered 2018-04-15 – 2018-04-16 (×3): 5 mg via INTRAVENOUS
  Filled 2018-04-15 (×3): qty 5

## 2018-04-15 MED ORDER — MIRTAZAPINE 15 MG PO TABS
30.0000 mg | ORAL_TABLET | Freq: Every day | ORAL | Status: DC
  Administered 2018-04-15: 30 mg via ORAL
  Filled 2018-04-15: qty 2

## 2018-04-15 MED ORDER — DICLOFENAC SODIUM 1 % TD GEL
2.0000 g | Freq: Four times a day (QID) | TRANSDERMAL | Status: DC
  Administered 2018-04-15 – 2018-04-16 (×4): 2 g via TOPICAL
  Filled 2018-04-15: qty 100

## 2018-04-15 MED ORDER — ACETAMINOPHEN 650 MG RE SUPP: 650.0000 mg | Freq: Four times a day (QID) | RECTAL | Status: DC | PRN

## 2018-04-15 MED ORDER — ASPIRIN 81 MG PO TABS: 81.0000 mg | ORAL_TABLET | Freq: Every day | ORAL | Status: DC

## 2018-04-15 MED ORDER — ATORVASTATIN CALCIUM 10 MG PO TABS
10.0000 mg | ORAL_TABLET | Freq: Every day | ORAL | Status: DC
  Administered 2018-04-15 – 2018-04-16 (×2): 10 mg via ORAL
  Filled 2018-04-15 (×2): qty 1

## 2018-04-15 MED ORDER — IPRATROPIUM-ALBUTEROL 0.5-2.5 (3) MG/3ML IN SOLN
3.0000 mL | Freq: Four times a day (QID) | RESPIRATORY_TRACT | Status: DC
  Administered 2018-04-15 (×2): 3 mL via RESPIRATORY_TRACT
  Filled 2018-04-15 (×3): qty 3

## 2018-04-15 MED ORDER — ORAL CARE MOUTH RINSE
15.0000 mL | Freq: Two times a day (BID) | OROMUCOSAL | Status: DC
  Administered 2018-04-16: 15 mL via OROMUCOSAL

## 2018-04-15 MED ORDER — MOMETASONE FURO-FORMOTEROL FUM 200-5 MCG/ACT IN AERO
2.0000 | INHALATION_SPRAY | Freq: Two times a day (BID) | RESPIRATORY_TRACT | Status: DC
  Administered 2018-04-15 – 2018-04-16 (×4): 2 via RESPIRATORY_TRACT
  Filled 2018-04-15: qty 8.8

## 2018-04-15 MED ORDER — ALBUTEROL SULFATE (2.5 MG/3ML) 0.083% IN NEBU
5.0000 mg | INHALATION_SOLUTION | Freq: Once | RESPIRATORY_TRACT | Status: AC
  Administered 2018-04-15: 5 mg via RESPIRATORY_TRACT
  Filled 2018-04-15: qty 6

## 2018-04-15 MED ORDER — ASPIRIN 81 MG PO CHEW
81.0000 mg | CHEWABLE_TABLET | Freq: Every day | ORAL | Status: DC
  Administered 2018-04-15 – 2018-04-16 (×2): 81 mg via ORAL
  Filled 2018-04-15 (×2): qty 1

## 2018-04-15 MED ORDER — VERAPAMIL HCL 40 MG PO TABS
40.0000 mg | ORAL_TABLET | Freq: Every day | ORAL | Status: DC
  Filled 2018-04-15: qty 1

## 2018-04-15 MED ORDER — ACETAMINOPHEN 325 MG PO TABS: 650.0000 mg | ORAL_TABLET | Freq: Four times a day (QID) | ORAL | Status: DC | PRN

## 2018-04-15 MED ORDER — ALBUTEROL SULFATE (2.5 MG/3ML) 0.083% IN NEBU
2.5000 mg | INHALATION_SOLUTION | RESPIRATORY_TRACT | Status: DC | PRN
  Administered 2018-04-15 – 2018-04-16 (×2): 2.5 mg via RESPIRATORY_TRACT
  Filled 2018-04-15 (×3): qty 3

## 2018-04-15 MED ORDER — LORAZEPAM 0.5 MG PO TABS
0.5000 mg | ORAL_TABLET | Freq: Two times a day (BID) | ORAL | Status: DC | PRN
  Administered 2018-04-15 – 2018-04-16 (×2): 0.5 mg via ORAL
  Filled 2018-04-15 (×2): qty 1

## 2018-04-15 MED ORDER — METHYLPREDNISOLONE SODIUM SUCC 125 MG IJ SOLR
125.0000 mg | Freq: Once | INTRAMUSCULAR | Status: AC
  Administered 2018-04-15: 125 mg via INTRAVENOUS
  Filled 2018-04-15: qty 2

## 2018-04-15 MED ORDER — PANTOPRAZOLE SODIUM 40 MG PO TBEC
40.0000 mg | DELAYED_RELEASE_TABLET | Freq: Every day | ORAL | Status: DC
Start: 2018-04-15 — End: 2018-04-16
  Administered 2018-04-15 – 2018-04-16 (×2): 40 mg via ORAL
  Filled 2018-04-15 (×2): qty 1

## 2018-04-15 MED ORDER — ONDANSETRON HCL 4 MG PO TABS
4.0000 mg | ORAL_TABLET | Freq: Once | ORAL | Status: AC
  Administered 2018-04-15: 4 mg via ORAL
  Filled 2018-04-15: qty 1

## 2018-04-15 NOTE — ED Notes (Signed)
Pt requesting nerve pill and wants. "to be knocked out" will inform provider.

## 2018-04-15 NOTE — ED Notes (Signed)
Ptresting with eyes closed but briefly opens them to deny pain. resp even and non labored with o2 @2l /[place].

## 2018-04-15 NOTE — ED Notes (Signed)
Pt c/o/ pain at right ankle Pt right foot placed on pillow and pt states pain improved.

## 2018-04-15 NOTE — ED Notes (Signed)
Pt now denies chest pain lying on right side. MD aware.

## 2018-04-15 NOTE — ED Triage Notes (Signed)
Patient arrived with EMS from [facility] with SOB/Wheezing onset this morning , she received Albuterol 10mg /Atrovent 0.5 mg nebulizer treatment prior to arrival , she also received Metropolol 10 mg IV for HR= 110's AFib . History of COPD .

## 2018-04-15 NOTE — ED Provider Notes (Signed)
[facility] EMERGENCY DEPARTMENT Provider Note   CSN: [id number] Arrival date & time: 04/15/18  0524     History   Chief Complaint Chief Complaint  Patient presents with  . Shortness of Breath  . Wheezing    HPI Anita Madden is a 83 y.o. female.  The history is provided by the patient.  She has history of hypertension, hyperlipidemia, COPD, stroke and comes in with increasing difficulty breathing over the last 3 days.  She denies any cough.  She has noted some tightness in her chest.  She denies fever, chills, sweats.  She denies nausea, vomiting.  She did not use her home inhaler.  She came in by EMS who noted what they thought was atrial fibrillation and gave her a dose of metoprolol as well as albuterol with ipratropium via nebulizer.  She states that she quit smoking 3 years ago.  She is not on home oxygen.  Past Medical History:  Diagnosis Date  . Anxiety   . COPD (chronic obstructive pulmonary disease) (HCC)   . Dementia   . Hyperlipidemia   . Hypertension   . Stroke [facility]) 2000    Patient Active Problem List   Diagnosis Date Noted  . Solitary pulmonary nodule 01/28/2018  . COPD GOLD II  07/12/2015  . Chronic respiratory failure with hypoxia (HCC) 07/12/2015  . DOE (dyspnea on exertion) 07/11/2015    Past Surgical History:  Procedure Laterality Date  . APPENDECTOMY  1976  . VESICOVAGINAL FISTULA CLOSURE W/ TAH  1976     OB History   None      Home Medications    Prior to Admission medications   Medication Sig Start Date End Date Taking? Authorizing Provider  acetaminophen (TYLENOL) 650 MG CR tablet Take 650 mg by mouth every 8 (eight) hours as needed for pain.    [provider]  aspirin 81 MG tablet Take 81 mg by mouth daily.    [provider]  atorvastatin (LIPITOR) 10 MG tablet 10 mg daily. 12/14/17   [provider]  budesonide (PULMICORT) 0.25 MG/2ML nebulizer solution 0.25 mcg twice daily 01/27/18    Nyoka Cowden, MD  calcium-vitamin D (OSCAL WITH D) 500-200 MG-UNIT tablet Take 1 tablet by mouth 2 (two) times daily.    [provider]  cholecalciferol (VITAMIN D) 1000 UNITS tablet Take 1,000 Units by mouth daily.    [provider]  citalopram (CELEXA) 10 MG tablet Take 10 mg by mouth daily.    [provider]  docusate sodium (COLACE) 100 MG capsule Take 100 mg by mouth 2 (two) times daily.    [provider]  donepezil (ARICEPT) 10 MG tablet 10 mg daily. 12/15/17   [provider]  formoterol (PERFOROMIST) 20 MCG/2ML nebulizer solution Take 2 mLs (20 mcg total) by nebulization 2 (two) times daily. Use in nebulizer twice daily perfectly regularly 01/27/18   Nyoka Cowden, MD  guaiFENesin-dextromethorphan (ROBITUSSIN DM) 100-10 MG/5ML syrup Take 5 mLs by mouth every 4 (four) hours as needed for cough.    [provider]  ipratropium-albuterol (DUONEB) 0.5-2.5 (3) MG/3ML SOLN Take 3 mLs by nebulization 4 (four) times daily.    [provider]  LORazepam (ATIVAN) 0.5 MG tablet Take 0.5 mg by mouth 2 (two) times daily.    [provider]  mirtazapine (REMERON) 30 MG tablet 30 mg at bedtime. 12/24/17   [provider]  Multiple Vitamins-Minerals (MULTIVITAL) tablet Take 1 tablet by  mouth daily.    [provider]  omeprazole (PRILOSEC) 20 MG capsule Take 20 mg by mouth daily.     [provider]  OXYGEN 2 lpm all the time [facility]    [provider]  predniSONE (DELTASONE) 10 MG tablet 10 mg 2 (two) times daily. 12/02/17   [provider]  Respiratory Therapy Supplies (FLUTTER) DEVI Use as directed Patient not taking: Reported on 12/30/2017 07/11/15   Nyoka Cowden, MD  traMADol (ULTRAM) 50 MG tablet Take 50 mg by mouth 2 (two) times daily as needed.    [provider]  triamterene-hydrochlorothiazide (MAXZIDE-25) 37.5-25 MG tablet Take 1 tablet by mouth daily.    [provider]  verapamil (CALAN) 40 MG tablet Take 40 mg by mouth 3 (three) times daily. Patient taking one daily    [provider]    Family History Family History  Problem Relation Age of Onset  . Emphysema Father        smoked  . Heart disease Mother   . Cervical cancer Mother   . Bladder Cancer Brother     Social History Social History   Tobacco Use  . Smoking status: Former Smoker    Packs/day: 1.00    Years: 63.00    Pack years: 63.00    Types: Cigarettes    Last attempt to quit: 06/16/2015    Years since quitting: 2.8  . Smokeless tobacco: Never Used  Substance Use Topics  . Alcohol use: Not Currently    Alcohol/week: 0.0 oz    Comment: quit 2000  . Drug use: Never     Allergies   Penicillins   Review of Systems Review of Systems  All other systems reviewed and are negative.    Physical Exam Updated Vital Signs BP (!) 109/44   Pulse (!) 105   Temp 98.7 F (37.1 C) (Oral)   Resp (!) 22   SpO2 100%   Physical Exam  Nursing note and vitals reviewed.  83 year old female, resting comfortably and in no acute distress. Vital signs are significant for elevated respiratory rate and heart rate. Oxygen saturation is 100%, which is normal. Head is normocephalic and atraumatic. PERRLA, EOMI. Oropharynx is clear. Neck is nontender and supple without adenopathy or JVD. Back is nontender and there is no CVA tenderness. Lungs have markedly diminished airflow throughout, with with coarse expiratory wheezes but no rales or rhonchi. Chest is nontender. Heart has regular rate and rhythm without murmur. Abdomen is soft, flat, nontender without masses or hepatosplenomegaly and peristalsis is normoactive. Extremities have no cyanosis or edema, full range of motion is present. Skin is warm and dry without rash. Neurologic: Mental status is normal, cranial nerves are intact, there are no motor or sensory deficits.  ED Treatments / Results  Labs (all labs  ordered are listed, but only abnormal results are displayed) Labs Reviewed  BASIC METABOLIC PANEL - Abnormal; Notable for the following components:      Result Value   Sodium 134 (*)    Chloride 96 (*)    Glucose, Bld 125 (*)    BUN 46 (*)    Creatinine, Ser 1.12 (*)    GFR calc non Af Amer 45 (*)    GFR calc Af Amer 52 (*)    All other components within normal limits  CBC WITH DIFFERENTIAL/PLATELET - Abnormal; Notable for the following components:   WBC 11.1 (*)    Hemoglobin 10.8 (*)  HCT 34.7 (*)    Neutro Abs 8.8 (*)    All other components within normal limits  I-STAT TROPONIN, ED    EKG EKG Interpretation  Date/Time:  Wednesday April 15 2018 05:30:14 EDT Ventricular Rate:  93 PR Interval:    QRS Duration: 74 QT Interval:  328 QTC Calculation: 408 R Axis:   49 Text Interpretation:  Sinus tachycardia Multiple premature complexes, vent & supraven Borderline repolarization abnormality When compared with ECG of 10/09/2000, Premature atrial complexes are now present Confirmed by Dione BoozeGlick, Cliffie Gingras ([id number]) on 04/15/2018 5:33:10 AM   Radiology Dg Chest 2 View  Result Date: 04/15/2018 CLINICAL DATA:  83 year old female with shortness of breath EXAM: CHEST - 2 VIEW COMPARISON:  Chest radiograph dated 01/27/2018 FINDINGS: The lungs are clear. Faint nodular density in the right mid lung field as seen on the prior radiograph. CT was recommended for better evaluation. There is no pleural effusion or pneumothorax. Top-normal cardiac silhouette. No acute osseous pathology. IMPRESSION: 1. No acute cardiopulmonary process. 2. Faint nodular density in the right middle lobe. Correlation with CT on a nonemergent basis recommended. Electronically Signed   By: Elgie CollardArash  Radparvar M.D.   On: 04/15/2018 06:35    Procedures Procedures  Medications Ordered in ED Medications  albuterol (PROVENTIL) (2.5 MG/3ML) 0.083% nebulizer solution 5 mg (has no administration in time range)     Initial  Impression / Assessment and Plan / ED Course  I have reviewed the triage vital signs and the nursing notes.  Pertinent labs & imaging results that were available during my care of the patient were reviewed by me and considered in my medical decision making (see chart for details).  COPD exacerbation.  EMS felt they were treating atrial fibrillation, I suspect but they were seeing was multifocal atrial tachycardia.  Old records are reviewed, and she has had her COPD managed as an outpatient, no ED visits or hospitalizations for COPD.  She will be given methylprednisolone and additional albuterol with ipratropium.  Will check chest x-ray and screening labs.  She required a third albuterol with ipratropium nebulizer treatment.  Following this, lungs sounded clear and there was improved airflow.  Chest x-ray showed no pneumonia.  Labs did show mild anemia with a 2 g drop in hemoglobin from April 30.  When she was taken off of oxygen, oxygen saturation dropped to 89%.  She is not on home oxygen.  She also had persistent tachycardia.  She was not felt to be safe for discharge.  Case is discussed with Dr. Antony ContrasGuilloud of internal medicine teaching service who agrees to admit the patient.  Final Clinical Impressions(s) / ED Diagnoses   Final diagnoses:  COPD exacerbation (HCC)  Hypoxia  Normochromic normocytic anemia    ED Discharge Orders    None       Dione BoozeGlick, Darrian Grzelak, MD 04/15/18 [phone]

## 2018-04-15 NOTE — ED Notes (Signed)
Patient transported to X-ray 

## 2018-04-15 NOTE — ED Notes (Addendum)
On arrival to room pt c/o chest pain/heart burn.Will contact providerQuarter size areaof redness noted art right lateral ankle. Also small area of excoriation noted midline back above buttock. Pt request position on right side. Pillow used for ankles.

## 2018-04-15 NOTE — ED Notes (Signed)
MD removed 2L [place] and patients 02 dropped from 100% to 89% therefore MD reapplied 2L [place] and decided to admit patient.

## 2018-04-15 NOTE — Progress Notes (Signed)
Paged for an abnormal EKG, chest pain, and shortness of breath. [name] was admit earlier today for COPD exacerbation and is being treated with scheduled duonebs and received IV solumedrol. We went to the room to evaluate [name]. She says that she has had persistent chest pain since the time of admission. The chest pain is dull and associated with nausea and palpitations. The pain is non radiating, not associated with neck or arm pain, and does not worsen with deep inspiration.The pain is similar to the heartburn that she experiences at home and improved with GI cocktail earlier today.   Vitals: HR 110-120 by tele, BP 133/112, SpO2 99% on home 2 L [place]  General: appears uncomfortable unable to find a comfortable position in the bed, non diaphoretic, not in acute distress   Cardiac: tachycardic, irregular rate and rhythm, no murmurs, normal S1 and S2  Pulm: breath sounds are distant but heard in all lung fields, no wheezes or rhonchi, exquisitely tender to palpation over the midsternum  GI: bowel sounds are present, the abdomen is soft, non distended, tender to palpation over the left upper quadrant and epigastrum   EKG is consistent with an irregular sinus tachycardia (each QRS complex is proceeded by a P wave), no acute ST segment or T wave abnormalities.   A/P:  Symptoms are described as being similar to what she feels when she has heartburn at home. Ranitidine is on her home medication list and she was started on protonix earlier today. Her chest pain seems to be multifactorial most likely related to GERD and costochondritis. She is also in an irregular tachycardia which is sustained around 110-120 based on review of the tele, this does not seem to be the cause of her symptoms. The tachycardia may be related to albuterol treatments. She does not have signs/ symptoms of an infection as a cause of this however she does have anemia which is new from the time of prior labs one and a half months ago.  She denies melena or BRBPR but new anemia in combination with her GERD like symptoms is concerning for PUD.   - STAT troponin - FOBT  - voltaren for chest wall pain  - GI cocktail  - tylenol PRN  - K pad  - continue home verapamil

## 2018-04-15 NOTE — H&P (Signed)
Date: 04/15/2018               Patient Name:  Anita Madden MRN: [id number]  DOB: 10-16-1934 Age / Sex: 83 y.o., female   PCP: Ignatius Specking, MD         Medical Service: Internal Medicine Teaching Service         Attending Physician: Dr. Dione Booze, MD    First Contact: Dr. Chesley Mires Pager: [phone]  Second Contact: Dr. Samuella Cota Pager: [phone]       After Hours (After 5p/  First Contact Pager: [phone]  weekends / holidays): Second Contact Pager: [phone]   Chief Complaint: Shortness of breath   History of Present Illness: Patient is an 83 year old female with a past medical history of dementia, CVA, carotid artery stenosis status post left CAE, atrial fibrillation, and chronic hypoxic respiratory failure on 2L [place] secondary to COPD presenting with 1 day history of shortness of breath.  History was limited due to patient's dementia. She is able to answer some questions but is inconsistent in her responses. History was mostly obtained via chart review and over the phone from her living facility.  Patient arrived this morning via EMS from [facility]. Nurse reports patient was complaining that she could not breathe this morning and noted to have wheezing. She was sent to the ED for evaluation. At baseline, patient is oriented to self and can carry on a conversation. However has difficulty with both short and long term memory. She has difficulty eating and mostly stays in bed all day. On EMS arrival, there was concern that patient was in atrial fibrillation with RVR. She received an albuterol / atrovent nebulizer and IV metoprolol 10 mg x 1 prior to arrival.   On arrival to the ED, patient was afebrile T 98.77F and hemodynamically stable with BP 109/44, HR 105, RR 22, and oxygen 100% on [place]. BMP was significant for sodium of 134, potassium 4.0, Scr 1.12, BUN 46, and glucose of 125. CBC with wbc of 11.1, hemoglobin 10.8 (from 12.9 two months prior) and MCV 89. I-stat  troponin was 0.05. EKG was demonstrated sinus arrhythmia with PACs. CXR was negative aside from an incidentally noted right middle lobe nodule. Non urgent CT was recommended for follow up.   Meds:  Current Meds  Medication Sig  . acetaminophen (TYLENOL) 325 MG tablet Take 650 mg by mouth every 6 (six) hours as needed for mild pain or fever.  Marland Kitchen aspirin 81 MG tablet Take 81 mg by mouth daily.  Marland Kitchen atorvastatin (LIPITOR) 10 MG tablet Take 10 mg by mouth daily.   . budesonide (PULMICORT) 0.25 MG/2ML nebulizer solution 0.25 mcg twice daily (Patient taking differently: Take 0.25 mg by nebulization 2 (two) times daily as needed. )  . budesonide-formoterol (SYMBICORT) 160-4.5 MCG/ACT inhaler Inhale 2 puffs into the lungs 2 (two) times daily.  Marland Kitchen docusate sodium (COLACE) 100 MG capsule Take 100 mg by mouth daily as needed for mild constipation.   . ENSURE (ENSURE) Take 237 mLs by mouth 3 (three) times daily between meals.  Marland Kitchen guaiFENesin-dextromethorphan (ROBITUSSIN DM) 100-10 MG/5ML syrup Take 5 mLs by mouth every 4 (four) hours as needed for cough.  Marland Kitchen ipratropium-albuterol (DUONEB) 0.5-2.5 (3) MG/3ML SOLN Take 3 mLs by nebulization 4 (four) times daily.  Marland Kitchen LORazepam (ATIVAN) 0.5 MG tablet Take 0.5 mg by mouth daily as needed for anxiety.   Marland Kitchen LORazepam (ATIVAN) 0.5 MG tablet Take 0.5 mg by mouth  at bedtime.  . mirtazapine (REMERON) 30 MG tablet Take 30 mg by mouth at bedtime.   Marland Kitchen neomycin-bacitracin-polymyxin (NEOSPORIN) ointment Apply 1 application topically daily. To affected area on Left Arm  . nitrofurantoin, macrocrystal-monohydrate, (MACROBID) 100 MG capsule Take 100 mg by mouth 2 (two) times daily.  . predniSONE (DELTASONE) 10 MG tablet Take 10 mg by mouth 2 (two) times daily.   . ranitidine (ZANTAC) 150 MG tablet Take 150 mg by mouth at bedtime.  . traMADol-acetaminophen (ULTRACET) 37.5-325 MG tablet Take 1 tablet by mouth 2 (two) times daily.  . verapamil (CALAN) 40 MG tablet Take 40 mg by mouth  daily. Patient taking one daily     Allergies: Allergies as of 04/15/2018 - Review Complete 04/15/2018  Allergen Reaction Noted  . Penicillins Hives and Swelling 07/11/2015   Past Medical History:  Diagnosis Date  . Anxiety   . COPD (chronic obstructive pulmonary disease) (HCC)   . Dementia   . Hyperlipidemia   . Hypertension   . Stroke [facility]) 2000    Family History: Unable to obtain due to dementia     Social History: Former chronic smoker. Per chart review, 63 year pack history. Unable to confirm.   Social History   Socioeconomic History  . Marital status: Widowed    Spouse name: Not on file  . Number of children: Not on file  . Years of education: Not on file  . Highest education level: Not on file  Occupational History  . Not on file  Social Needs  . Financial resource strain: Not on file  . Food insecurity:    Worry: Not on file    Inability: Not on file  . Transportation needs:    Medical: Not on file    Non-medical: Not on file  Tobacco Use  . Smoking status: Former Smoker    Packs/day: 1.00    Years: 63.00    Pack years: 63.00    Types: Cigarettes    Last attempt to quit: 06/16/2015    Years since quitting: 2.8  . Smokeless tobacco: Never Used  Substance and Sexual Activity  . Alcohol use: Not Currently    Alcohol/week: 0.0 oz    Comment: quit 2000  . Drug use: Never  . Sexual activity: Not on file  Lifestyle  . Physical activity:    Days per week: Not on file    Minutes per session: Not on file  . Stress: Not on file  Relationships  . Social connections:    Talks on phone: Not on file    Gets together: Not on file    Attends religious service: Not on file    Active member of club or organization: Not on file    Attends meetings of clubs or organizations: Not on file    Relationship status: Not on file  . Intimate partner violence:    Fear of current or ex partner: Not on file    Emotionally abused: Not on file    Physically abused: Not on  file    Forced sexual activity: Not on file  Other Topics Concern  . Not on file  Social History Narrative   Lives at [facility] since Oct 2018   Education- 10   Children- 2    Review of Systems: A complete ROS was negative except as per HPI.   Physical Exam: Blood pressure (!) 131/54, pulse 92, temperature 98.7 F (37.1 C), temperature source Oral, resp. rate 20, SpO2 98 %.  Constitutional: NAD, appears comfortable HEENT: Atraumatic, normocephalic. PERRL, anicteric sclera.  Neck: Supple, trachea midline.  Cardiovascular: Tachycardic and irregular, no murmurs, rubs, or gallops.  Pulmonary/Chest: CTAB, no wheezes, Bibasilar crackles Abdominal: Soft, non tender, non distended. +BS.  Extremities: Warm and well perfused. Bilateral non pitting edema  Neuro: No focal deficits, moving all extremities spontaneously  Skin: Multiple superficial ecchymosis  Psychiatric: Normal mood and affect   EKG: personally reviewed my interpretation is: Sinus arrhythmia vs. Sinus with PACs   CXR: personally reviewed my interpretation is: Negative CXR, no infiltrate or consolidation    Assessment & Plan by Problem:  Patient is a 83 yo F with a pmhx of dementia, CVA, atrial fibrillation, and COPD on 2L  continuously presenting from her living facility with one day of shortness of breath and wheezing. Admitted for treatment of COPD exacerbation.   COPD Exacerbation: Overall mild. S/p 3 nebulized breathing treatments in the ED and IV solumedrol. Her wheezing has resolved. She appears comfortably on her home oxygen. Per chart review, she follows with Dr. Sherene SiresWert (pulmonology). Last PFTs on 10/12/15 demonstrated FEV1/FVC ratio of 57% and FEV1 of 56%. She is prescribed Symbicort BID and albuterol as needed. It appears she is currently prescribed prednisone, receiving 10 mg BID at her living facility. I have spoken with her living facility who is faxing over her med list.  -- Scheduled duonebs q6h  while awake -- Albuterol q4h prn -- S/p IV solumedrol 125 mg in ED -- Reassess tomorrow; if patient continues to wheeze she may require a short course of prednisone 40 mg. Otherwise can likely resume home 10 mg BID dosing  -- Follow up med rec from living facility   Hx Atrial Fibrillation: Question of atrial fibrillation with RVR on EMS arrival, s/p IV metoprolol x 1. EKG in ED appeared sinus, however on admission evaluation HR was persistently elevated to 120s and very irregular on cardiac monitor.  -- Repeat EKG -- Tele monitoring -- Continue home verapamil 40 mg daily   Right Middle Lobe Nodule: Incidentally noted on CXR. Giving extensive 63 year pack history will need follow up CT to rule out malignancy.  -- Outpatient follow up   Normocytic Anemia: Hemoglobin 10.8 down from 12.9 two months ago.  -- Repeat AM CBC -- If persistently low, consider iron studies, FOBT for further work up  Hx of Ischemic Right PCA CVA:  -- Continue ASA 81 daily -- Continue Lipitor 10 mg daily   GERD: Patient complaining of "heart burn" in ED. -- GI cocktail prn x 1 -- Can add pepcid if persistent symptoms   Dementia: Per neurology note on 12/30/17, patient should be on daily donepezil however this is not listed on her chart. Will follow up med rec from living facility.  -- Continue home remeron 30 mg QHS  -- Continue home ensure supplements   FEN: no fluids, replete lytes prn, regular diet VTE ppx: Lovenox Code Status: DNR  Dispo: Admit patient to Observation with expected length of stay less than 2 midnights.  SignedReymundo Poll: Aras Albarran, MD 04/15/2018, 8:09 AM  Pager: [phone]

## 2018-04-15 NOTE — Progress Notes (Signed)
CRITICAL VALUE ALERT  Critical Value: Troponin 0.07  Date & Time Notied: 04/15/18 2232  Provider Notified: On call IMTS MD  Orders Received/Actions taken: Awaiting response

## 2018-04-15 NOTE — ED Notes (Signed)
Admitting at bedside 

## 2018-04-15 NOTE — ED Notes (Signed)
Pt states she does not want Ensure at this time.

## 2018-04-15 NOTE — ED Notes (Signed)
EMT assisted patient with feeding.  Patient only wanted a few bites of oatmeal and a few bites of pancakes.  Patient drank one   4Oz orange juice.

## 2018-04-15 NOTE — Consult Note (Signed)
WOC Nurse wound consult note Reason for Consult: Right lateral malleolus has a stage 2, sacral area at proximal end of gluteal cleft and medial left buttock partial thickness wounds Wound type: pressure, MASD IAD Pressure Injury POA: Yes Measurement:2cm x 2cm x 0.1cm right lateral ankle, small wound bed in center (0.1cm round) is open. Pink wound bed in center, surrounded by 2cm round reddened skin.  No drainage or odor noted. Medial left buttock, 1cm round x 0.1cm partial thickness wound, pink wound bed. Proximal gluteal cleft has two areas of partial thickness wounds with dried exudate. Wound bed:see above Drainage (amount, consistency, odor) scant Periwound: see above Dressing procedure/placement/frequency: Protective foam and Prevalon Boots ordered. Sacral foam ordered along with Purple Top Barrier Cream. Patient now has PureWick in place. Patient experiencing respiritory difficulty, being moved to 2W04. We will not follow, but will remain available to this patient, to nursing, and the medical and/or surgical teams. Please re-consult if we need to assist further.  Barnett HatterMelinda Christohper Dube, RN-C, WTA-C, OCA Wound Treatment Associate Ostomy Care Associate

## 2018-04-15 NOTE — ED Notes (Addendum)
Pt c/o chest pain, "Heart burn" . After eating a few bites of breakfast tray. Will inform provider.StatesI cant get comfortable to breath. Pt repositioned with draw sheet

## 2018-04-16 DIAGNOSIS — Z880 Allergy status to penicillin: Secondary | ICD-10-CM

## 2018-04-16 DIAGNOSIS — Z9981 Dependence on supplemental oxygen: Secondary | ICD-10-CM

## 2018-04-16 DIAGNOSIS — Z79899 Other long term (current) drug therapy: Secondary | ICD-10-CM

## 2018-04-16 DIAGNOSIS — R911 Solitary pulmonary nodule: Secondary | ICD-10-CM

## 2018-04-16 DIAGNOSIS — J441 Chronic obstructive pulmonary disease with (acute) exacerbation: Secondary | ICD-10-CM

## 2018-04-16 DIAGNOSIS — L89512 Pressure ulcer of right ankle, stage 2: Secondary | ICD-10-CM

## 2018-04-16 DIAGNOSIS — Z87891 Personal history of nicotine dependence: Secondary | ICD-10-CM

## 2018-04-16 DIAGNOSIS — K219 Gastro-esophageal reflux disease without esophagitis: Secondary | ICD-10-CM

## 2018-04-16 DIAGNOSIS — J9621 Acute and chronic respiratory failure with hypoxia: Secondary | ICD-10-CM

## 2018-04-16 DIAGNOSIS — Z792 Long term (current) use of antibiotics: Secondary | ICD-10-CM

## 2018-04-16 DIAGNOSIS — L89159 Pressure ulcer of sacral region, unspecified stage: Secondary | ICD-10-CM

## 2018-04-16 DIAGNOSIS — Z7952 Long term (current) use of systemic steroids: Secondary | ICD-10-CM

## 2018-04-16 DIAGNOSIS — F0390 Unspecified dementia without behavioral disturbance: Secondary | ICD-10-CM

## 2018-04-16 DIAGNOSIS — R000 Tachycardia, unspecified: Secondary | ICD-10-CM

## 2018-04-16 LAB — CBC
HCT: 30.6 % — ABNORMAL LOW (ref 36.0–46.0)
Hemoglobin: 9.5 g/dL — ABNORMAL LOW (ref 12.0–15.0)
MCH: 28.2 pg (ref 26.0–34.0)
MCHC: 31 g/dL (ref 30.0–36.0)
MCV: 90.8 fL (ref 78.0–100.0)
PLATELETS: 274 10*3/uL (ref 150–400)
RBC: 3.37 MIL/uL — ABNORMAL LOW (ref 3.87–5.11)
RDW: 14.8 % (ref 11.5–15.5)
WBC: 12 10*3/uL — ABNORMAL HIGH (ref 4.0–10.5)

## 2018-04-16 LAB — BASIC METABOLIC PANEL
Anion gap: 11 (ref 5–15)
BUN: 37 mg/dL — ABNORMAL HIGH (ref 8–23)
CO2: 29 mmol/L (ref 22–32)
CREATININE: 0.98 mg/dL (ref 0.44–1.00)
Calcium: 8.6 mg/dL — ABNORMAL LOW (ref 8.9–10.3)
Chloride: 98 mmol/L (ref 98–111)
GFR calc Af Amer: >60 mL/min (ref >60)
GFR calc non Af Amer: 53 mL/min — ABNORMAL LOW (ref >60)
GLUCOSE: 104 mg/dL — AB (ref 70–99)
Potassium: 3.8 mmol/L (ref 3.5–5.1)
Sodium: 138 mmol/L (ref 135–145)

## 2018-04-16 LAB — TROPONIN I: Troponin I: 0.07 ng/mL (ref <0.03)

## 2018-04-16 MED ORDER — RANITIDINE HCL 150 MG PO TABS: 150.0000 mg | ORAL_TABLET | Freq: Two times a day (BID) | ORAL | 1 refills | Status: AC

## 2018-04-16 MED ORDER — RANITIDINE HCL 150 MG PO TABS: 150.0000 mg | ORAL_TABLET | Freq: Two times a day (BID) | ORAL | 1 refills | Status: DC

## 2018-04-16 MED ORDER — PREDNISONE 10 MG PO TABS
10.0000 mg | ORAL_TABLET | Freq: Two times a day (BID) | ORAL | Status: DC
  Administered 2018-04-16: 10 mg via ORAL
  Filled 2018-04-16: qty 1

## 2018-04-16 NOTE — Progress Notes (Signed)
Called report to [place]ole at [facility].

## 2018-04-16 NOTE — Discharge Summary (Addendum)
Name: Anita Madden MRN: [id number] DOB: 08/11/1935 83 y.o. PCP: Ignatius Specking, MD  Date of Admission: 04/15/2018  5:24 AM Date of Discharge:  Attending Physician: Burns Spain, MD  Discharge Diagnosis: 1. COPD exacerbation  Discharge Medications: Allergies as of 04/16/2018      Reactions   Penicillins Hives, Swelling      Medication List    TAKE these medications   acetaminophen 325 MG tablet Commonly known as:  TYLENOL Take 650 mg by mouth every 6 (six) hours as needed for mild pain or fever.   aspirin 81 MG tablet Take 81 mg by mouth daily.   atorvastatin 10 MG tablet Commonly known as:  LIPITOR Take 10 mg by mouth daily.   budesonide 0.25 MG/2ML nebulizer solution Commonly known as:  PULMICORT 0.25 mcg twice daily What changed:    how much to take  how to take this  when to take this  reasons to take this  additional instructions   budesonide-formoterol 160-4.5 MCG/ACT inhaler Commonly known as:  SYMBICORT Inhale 2 puffs into the lungs 2 (two) times daily.   docusate sodium 100 MG capsule Commonly known as:  COLACE Take 100 mg by mouth daily as needed for mild constipation.   ENSURE Take 237 mLs by mouth 3 (three) times daily between meals.   FLUTTER Devi Use as directed   formoterol 20 MCG/2ML nebulizer solution Commonly known as:  PERFOROMIST Take 2 mLs (20 mcg total) by nebulization 2 (two) times daily. Use in nebulizer twice daily perfectly regularly   guaiFENesin-dextromethorphan 100-10 MG/5ML syrup Commonly known as:  ROBITUSSIN DM Take 5 mLs by mouth every 4 (four) hours as needed for cough.   ipratropium-albuterol 0.5-2.5 (3) MG/3ML Soln Commonly known as:  DUONEB Take 3 mLs by nebulization 4 (four) times daily.   LORazepam 0.5 MG tablet Commonly known as:  ATIVAN Take 0.5 mg by mouth at bedtime.   LORazepam 0.5 MG tablet Commonly known as:  ATIVAN Take 0.5 mg by mouth daily as needed for anxiety.   mirtazapine 30 MG  tablet Commonly known as:  REMERON Take 30 mg by mouth at bedtime.   neomycin-bacitracin-polymyxin ointment Commonly known as:  NEOSPORIN Apply 1 application topically daily. To affected area on Left Arm   nitrofurantoin (macrocrystal-monohydrate) 100 MG capsule Commonly known as:  MACROBID Take 100 mg by mouth 2 (two) times daily.   OXYGEN 2 lpm all the time [facility]   predniSONE 10 MG tablet Commonly known as:  DELTASONE Take 10 mg by mouth 2 (two) times daily.   ranitidine 150 MG tablet Commonly known as:  ZANTAC Take 1 tablet (150 mg total) by mouth 2 (two) times daily. What changed:  when to take this   traMADol-acetaminophen 37.5-325 MG tablet Commonly known as:  ULTRACET Take 1 tablet by mouth 2 (two) times daily.   verapamil 40 MG tablet Commonly known as:  CALAN Take 40 mg by mouth daily. Patient taking one daily       Disposition and follow-up:   AnitaMaci GRAELYN Madden was discharged from [facility] in stable condition.  At the hospital follow up visit please address:     Mild COPD exacerbation: Continue home prednisone 10mg  twice daily Scheduled duonebs every 6 hours Continue home 2L continuous oxygen  Faint nodular density in the right middle lobe. Correlation with CT on a nonemergent basis recommended.  Tachyarrhythmia: Continue verapimil 40mg  daily  2.  Labs / imaging needed at time of  follow-up: none  3.  Pending labs/ test needing follow-up: recommended follow-up on faint nodular density in right middle lobe with chest CT on nonemergent basis  Follow-up Appointments:   Hospital Course by problem list: 1. Mild COPD exacerbation Ms. Hart RochesterLawson was admitted for treatment of mild COPD exacerbation. She received IV solumedrol in the ED, as well as scheduled duonebs. Her wheezing has resolved, and she is breathing comfortably and saturating well on her home oxygen. We did not feel she needed higher doses of Prednisone and thus resumed her  currently prescribed Prednisone 10 mg bid. Will also continue scheduled duonebs q 6 and her home Symbicort BID.   2. Tachyarrhythmia: There was question of afib with RVR on EMS arrival yesterday and she received IV metoprolol x1 prior to arrival to the ED. EKGs and telemetry since arrival have revealed sinus, possbily multifocal atach versus sinus with PACs. It is likely the breathing treatments contributed to the accelerated rate. She remained asymptomatic. Her tachycardia was never sustained above 130 so did not feel to start another medication. She was continued on verapimil 40mg  daily.   3. GERD: Patient complained of heartburn on arrival that was relieved with GI cocktail. She states this has been occurring every time she eats. Given her current chronic prednisone and antibiotic use and not able to get an idea of how long this is has been an issue, did not want to start her on PPI which would increase her risk for C. Diff. For now, will increase Ranitidine to bid. Could consider adding PPI if symptoms persist.   4. Right Middle Lobe Nodule: Incidentally noted on CXR. Given extensive 63 year pack history will need follow up CT to rule out malignancy on nonemergent basis as outpatient.   5. Sacral and right lateral malleolus pressure wounds. Wound care was consulted. Noted stage 2 pressure wound at right lateral malleolus without drainage or odor that was dressed, as well as partial thickness wound at proximal end of gluteal cleft. Sacral foam ordered, as well as barrier cream.    Discharge Vitals:   BP (!) 137/96   Pulse (!) 101   Temp 98.7 F (37.1 C) (Oral)   Resp 20   Ht 5\' 3"  (1.6 m)   Wt 139 lb 15.9 oz (63.5 kg)   SpO2 97%   BMI 24.80 kg/m   Pertinent Labs, Studies, and Procedures:  CBC Latest Ref Rng & Units 04/16/2018 04/15/2018 01/27/2018  WBC 4.0 - 10.5 K/uL 12.0(H) 11.1(H) 12.5(H)  Hemoglobin 12.0 - 15.0 g/dL 2.9(F9.5(L) 10.8(L) 12.9  Hematocrit 36.0 - 46.0 % 30.6(L) 34.7(L) 38.7    Platelets 150 - 400 K/uL 274 285 277.0   BMP Latest Ref Rng & Units 04/16/2018 04/15/2018 01/27/2018  Glucose 70 - 99 mg/dL 621(H104(H) 086(V125(H) 784(O124(H)  BUN 8 - 23 mg/dL 96(E37(H) 95(M46(H) 84(X37(H)  Creatinine 0.44 - 1.00 mg/dL 3.240.98 4.01(U1.12(H) 2.72(Z1.57(H)  Sodium 135 - 145 mmol/L 138 134(L) 142  Potassium 3.5 - 5.1 mmol/L 3.8 4.0 4.3  Chloride 98 - 111 mmol/L 98 96(L) 102  CO2 22 - 32 mmol/L 29 25 32  Calcium 8.9 - 10.3 mg/dL 3.6(U8.6(L) 8.9 9.4    CXR 4/40/34747/17/2019: 1. No acute cardiopulmonary process. 2. Faint nodular density in the right middle lobe. Correlation with CT on a nonemergent basis recommended.   Discharge Instructions: Discharge Instructions    Call MD for:  difficulty breathing, headache or visual disturbances   Complete by:  As directed    Call MD for:  extreme fatigue   Complete by:  As directed    Call MD for:  hives   Complete by:  As directed    Call MD for:  persistant dizziness or light-headedness   Complete by:  As directed    Call MD for:  persistant nausea and vomiting   Complete by:  As directed    Call MD for:  redness, tenderness, or signs of infection (pain, swelling, redness, odor or green/yellow discharge around incision site)   Complete by:  As directed    Call MD for:  severe uncontrolled pain   Complete by:  As directed    Call MD for:  temperature >100.4   Complete by:  As directed    Diet - low sodium heart healthy   Complete by:  As directed    Discharge instructions   Complete by:  As directed    Please continue your Prednisone, symbicort and scheduled duonebs as prescribed. No need for additional antibiotics or increase in Prednisone at this time.  We increased Ranitidine to twice a day for your heartburn.   Increase activity slowly   Complete by:  As directed       Signed: Bridget Hartshorn, DO 04/16/2018, 3:11 PM

## 2018-04-16 NOTE — Progress Notes (Signed)
  Date: 04/16/2018  Patient name: Azucena Fallennner M Rosamilia  Medical record number: [id number]  Date of birth: 03/08/1935   I have seen and evaluated Lisanne Shaune LeeksM Connon and discussed their care with the Residency Team. Ms Hart RochesterLawson is an 83 yo female with chronic hypoxic resp failure 2/2 COPD and dementia who is a resident of a memory care unit at [facility].  She was transported to the ED after a nurse at the facility reported that the patient stated she could not breathe and noted wheezing.  She received albuterol and Atrovent nebs, Solu-Medrol 125, and her home 2 L of O2 in the ED.  She was also felt to have atrial fibrillation and received IV metoprolol in the EMS.  On our examination of her EKGs, she has a sinus arrhythmia rather than atrial fibrillation.  This morning, she is alert without any respiratory distress.  She is maintaining oxygenation on her home O2.  She is afebrile and her chest x-ray has not shown any infiltrate.  Her rhythm continues to be a sinus arrhythmia.  Vitals:   04/16/18 0945 04/16/18 1424  BP: (!) 137/96   Pulse:    Resp:    Temp:    SpO2:  97%  Afebrile HR 60 - 101 RR 20 O2 sat high 90's on home dose of 2 L Lying on L side, no inc WOB, no acc muscle usage Alert, answers questions appropriately, falls asleep easily H irr, no MRG L good air flow, insp and exp wheezing but good air flow Ext non pitting edema Skin warm and dry  Cr 1.12 - 0.98 Trop 0.05 - 0.07 - 0.07 WBC 11.1 - 12 (after solumedrol in ED) HgB 9.5 (down from baseline of around 13) MCV 91 RDW 15  I personally viewed the CXR images and confirmed my reading with the official read. AP and lateral, No infiltrate or edema  I personally viewed the EKG and confirmed my reading with the official read. Sinus arrhythmia, nl axis, some lateral TW flattening  Assessment and Plan: I have seen and evaluated the patient as outlined above. I agree with the formulated Assessment and Plan as detailed in the residents'  note, with the following changes: Ms Hart RochesterLawson is an 83 yo female with chronic hypoxic resp failure 2/2 COPD and dementia who is a resident of a memory care unit at [facility].  She was transferred to the ED for an acute on chronic hypoxic respiratory failure episode thought to be due to a mild COPD exacerbation which has since stabilized.  She was also found to have a sinus arrhythmia with only minimal tachycardia requiring PRN IV beta-blockers.  1.  Acute on chronic hypoxic respiratory failure secondary to a mild COPD exacerbation -her chest x-ray does not show an infiltrate, she is afebrile, and her white blood cell count is not elevated from prior.  Her examination shows good airflow with some minimal wheezing on her pulmonology appointment in April.  She got 125 mg of Solu-Medrol in the ED and can resume her home prednisone dose.  2.  Sinus tachy arrhythmia - this is likely multifactorial from the albuterol nebs, prednisone, and the mild COPD exacerbation.  She is asymptomatic.  She has been continued on her chronic home verapamil.    Burns SpainButcher, Elizabeth A, MD 7/18/20194:31 PM

## 2018-04-16 NOTE — Progress Notes (Signed)
Subjective: [name] was seen and evaluated at bedside this morning. She was sleeping upon and entering. She continued to sleep throughout exam, but would respond appropriately to questions when prompted. She denied any difficulty breathing or burning in her chest that she was complaining of yesterday and last night. She saturating 97% on her home O2. Denies any fevers, chills, headaches, abdominal pain, n/v.  Reassessed patient again this afternoon. Continues to deny any dyspnea, pain or any other symptoms. Feels she is ready to return to assisted living.  Social worker following her case was able to reach daughter in law to give update on plan to discharge her back to assisted living today.    Objective:  Vital signs in last 24 hours: Vitals:   04/16/18 0332 04/16/18 0734 04/16/18 0744 04/16/18 0945  BP:  (!) 129/50  (!) 137/96  Pulse:  (!) 101    Resp:      Temp:  98.7 F (37.1 C)    TempSrc:  Oral    SpO2: 97% 99% 97%   Weight:      Height:       General: sleeping female appears comfortable and in NAD; arousable to questions and answering appropriately. Cardiovascular: tachycardic rate, regularly irregular rhythm. No murmurs, gallops or rubs.  Respiratory: No increased work of breathing; saturating well on baseline O2 supplement. scattered expiratory wheezes. No crackles or rhonchi appreciated.  Abdominal: bowel sounds +. Abdomen is soft, non-tender non-distended.  Extremities: warm and well perfused; Bilateral non-pitting edema. Pressure wound to right lateral malleolus with clean dressing.   Assessment/Plan:  Active Problems:   COPD exacerbation (HCC)   Pressure injury of skin 1. Mild COPD exacerbation She received IV solumedrol in the ED, as well as scheduled duonebs. Her wheezing is much improved; she is breathing comfortably and saturating well on her home oxygen. We did not feel she needed higher doses of Prednisone given clinical improvement; will plan to resume her  currently prescribed Prednisone 10 mg bid. Continue scheduled duonebs q 6.    2. Tachyarrhythmia: There was question of afib with RVR on EMS arrival yesterday and she received IV metoprolol x1 prior to arrival to the ED. EKGs and telemetry since arrival have revealed sinus, possbily multifocal atach versus sinus with PACs. It is likely the breathing treatments are contributing to her accelerated rate. She got an additional dose of Metoprolol 5 mg last night, but heart rate has otherwise remained stable but irregular ranging from 60-low 100s.  She remained asymptomatic. Since heart rate has not been sustained above 130 and patient has remained clinically stable, will not add additional therapy at this time. Will continue home verapimil 40mg  daily.   3. GERD:Patient complained of heartburn on arrival that was relieved with GI cocktail, as well as started on Protonix. She had another episode of pain last night that was again relieved with GI cocktail. She states this has been occurring every time she eats. Given her current chronic prednisone and antibiotic use and not able to get an idea of how long this is has been an issue, did not want to start her on PPI outpatient due to increased risk for C. Diff. For now, will increase Ranitidine to bid. Could consider adding PPI if symptoms persist.   4. Right Middle Lobe Nodule:Incidentally noted on CXR. Given extensive 63 year pack history will need follow up CT to rule out malignancy on nonemergent basis as outpatient.   5. Sacral and right lateral malleolus pressure wounds.  Wound care was consulted. Noted stage 2 pressure wound at right lateral malleolus without drainage or odor that was dressed, as well as partial thickness wound at proximal end of gluteal cleft. Sacral foam ordered, as well as barrier cream.    Dispo: Anticipated discharge back to [facility] assisted living today.   Lenward Chancellor D, DO 04/16/2018, 1:58 PM Pager: [phone]

## 2018-04-16 NOTE — Progress Notes (Signed)
[facility] Anticipated discharge date: 7/18 Family notified: pt dtr-in-law Transportation by [facility]- called at 3:55pm Report #: [phone]  CSW signing off.  Burna SisJenna H. Makalah Asberry, LCSW Clinical Social Worker [phone]

## 2018-04-16 NOTE — Care Management Obs Status (Signed)
MEDICARE OBSERVATION STATUS NOTIFICATION   Patient Details  Name: Anita Madden MRN: [id number] Date of Birth: 09/29/1935   Medicare Observation Status Notification Given:  Yes    Leone Havenaylor, Verity [place], RN 04/16/2018, 3:34 PM

## 2018-04-16 NOTE — Care Management Note (Signed)
Case Management Note  Patient Details  Name: Anita Madden MRN: [id number] Date of Birth: 01/03/1935  Subjective/Objective:  From [facility], will [facility], CSW aware.                  Action/Plan: DC back to ALF.  Expected Discharge Date:  04/16/18               Expected Discharge Plan:  Assisted Living / Rest Home  In-House Referral:  Clinical Social Work  Discharge planning Services  CM Consult  Post Acute Care Choice:    Choice offered to:     DME Arranged:    DME Agency:     HH Arranged:    HH Agency:     Status of Service:  Completed, signed off  If discussed at [employer] Meetings, dates discussed:    Additional Comments:  Anita Madden, Anita Wenrich Clinton, RN 04/16/2018, 3:53 PM

## 2018-04-16 NOTE — NC FL2 (Addendum)
[place] MEDICAID FL2 LEVEL OF CARE SCREENING TOOL     IDENTIFICATION  Patient Name: Anita Madden Birthdate: 12/18/1934 Sex: female Admission Date (Current Location): 04/15/2018  [facility] and [place] Number:  Producer, television/film/video and Address:  [facility] [place], [address], [place], [place] [address]      Provider Number: [id number]  Attending Physician Name and Address:  Burns Spain, MD  Relative Name and Phone Number:       Current Level of Care: Hospital Recommended Level of Care: Assisted Living Facility, Memory Care Prior Approval Number:    Date Approved/Denied:   PASRR Number:    Discharge Plan: Other (Comment)(ALF- Memory Care)    Current Diagnoses: Patient Active Problem List   Diagnosis Date Noted  . COPD exacerbation (HCC) 04/15/2018  . Pressure injury of skin 04/15/2018  . Solitary pulmonary nodule 01/28/2018  . COPD GOLD II  07/12/2015  . Chronic respiratory failure with hypoxia (HCC) 07/12/2015  . DOE (dyspnea on exertion) 07/11/2015    Orientation RESPIRATION BLADDER Height & Weight     Self, Place, Situation  O2(2L [place])   Weight: 139 lb 15.9 oz (63.5 kg) Height:  5\' 3"  (160 cm)  BEHAVIORAL SYMPTOMS/MOOD NEUROLOGICAL BOWEL NUTRITION STATUS        Diet(no added salt)  AMBULATORY STATUS COMMUNICATION OF NEEDS Skin   Extensive Assist Verbally     Sacral and right lateral malleolus pressure wounds. Wound care was consulted. Noted stage 2 pressure wound at right lateral malleolus without drainage or odor that was dressed, as well as partial thickness wound at proximal end of gluteal cleft. Sacral foam ordered, as well as barrier cream.                       Personal Care Assistance Level of Assistance  Bathing, Dressing Bathing Assistance: Maximum assistance   Dressing Assistance: Maximum assistance     Functional Limitations Info             SPECIAL CARE FACTORS FREQUENCY  PT (By licensed PT), OT (By  licensed OT)     PT Frequency: 3wk with home health OT Frequency: 3wk with home health            Contractures      Additional Factors Info  Code Status, Allergies Code Status Info: DNR Allergies Info: penicillins            Discharge Medications: Medication List    TAKE these medications   acetaminophen 325 MG tablet Commonly known as:  TYLENOL Take 650 mg by mouth every 6 (six) hours as needed for mild pain or fever.   aspirin 81 MG tablet Take 81 mg by mouth daily.   atorvastatin 10 MG tablet Commonly known as:  LIPITOR Take 10 mg by mouth daily.   budesonide 0.25 MG/2ML nebulizer solution Commonly known as:  PULMICORT 0.25 mcg twice daily What changed:    how much to take  how to take this  when to take this  reasons to take this  additional instructions   budesonide-formoterol 160-4.5 MCG/ACT inhaler Commonly known as:  SYMBICORT Inhale 2 puffs into the lungs 2 (two) times daily.   docusate sodium 100 MG capsule Commonly known as:  COLACE Take 100 mg by mouth daily as needed for mild constipation.   ENSURE Take 237 mLs by mouth 3 (three) times daily between meals.   FLUTTER Devi Use as directed   formoterol  20 MCG/2ML nebulizer solution Commonly known as:  PERFOROMIST Take 2 mLs (20 mcg total) by nebulization 2 (two) times daily. Use in nebulizer twice daily perfectly regularly   guaiFENesin-dextromethorphan 100-10 MG/5ML syrup Commonly known as:  ROBITUSSIN DM Take 5 mLs by mouth every 4 (four) hours as needed for cough.   ipratropium-albuterol 0.5-2.5 (3) MG/3ML Soln Commonly known as:  DUONEB Take 3 mLs by nebulization 4 (four) times daily.   LORazepam 0.5 MG tablet Commonly known as:  ATIVAN Take 0.5 mg by mouth at bedtime.   LORazepam 0.5 MG tablet Commonly known as:  ATIVAN Take 0.5 mg by mouth daily as needed for anxiety.   mirtazapine 30 MG tablet Commonly known as:  REMERON Take 30 mg by mouth at  bedtime.   neomycin-bacitracin-polymyxin ointment Commonly known as:  NEOSPORIN Apply 1 application topically daily. To affected area on Left Arm   nitrofurantoin (macrocrystal-monohydrate) 100 MG capsule Commonly known as:  MACROBID Take 100 mg by mouth 2 (two) times daily.   OXYGEN 2 lpm all the time [facility]   predniSONE 10 MG tablet Commonly known as:  DELTASONE Take 10 mg by mouth 2 (two) times daily.   ranitidine 150 MG tablet Commonly known as:  ZANTAC Take 1 tablet (150 mg total) by mouth 2 (two) times daily. What changed:  when to take this   traMADol-acetaminophen 37.5-325 MG tablet Commonly known as:  ULTRACET Take 1 tablet by mouth 2 (two) times daily.   verapamil 40 MG tablet Commonly known as:  CALAN Take 40 mg by mouth daily. Patient taking one daily       Relevant Imaging Results:  Relevant Lab Results:   Additional Information SS#: [id number]; please order home health RN for wound care  Jahleel Stroschein, Wyn QuakerJenna H, LCSW

## 2018-04-16 NOTE — Progress Notes (Signed)
2030: Pt c/o non-radiating CP with nausea and one instance of brown, gastric acid emesis. Pt HR elevated and irregular. An EKG was obtained and it showed A fib with RVR. On call MD notified.  2100: MD on unit to assess pt. New orders have been places

## 2018-04-30 DEATH — deceased

## 2018-05-05 ENCOUNTER — Ambulatory Visit: Payer: Medicare Other | Admitting: Internal Medicine

## 2019-01-04 IMAGING — DX DG CHEST 2V
2 series · 2 of 2 positions shown · non-contrast
Comparison: Chest radiograph dated 01/27/2018

CLINICAL DATA: 82-year-old female with shortness of breath

EXAM:
CHEST - 2 VIEW

[chest lat]
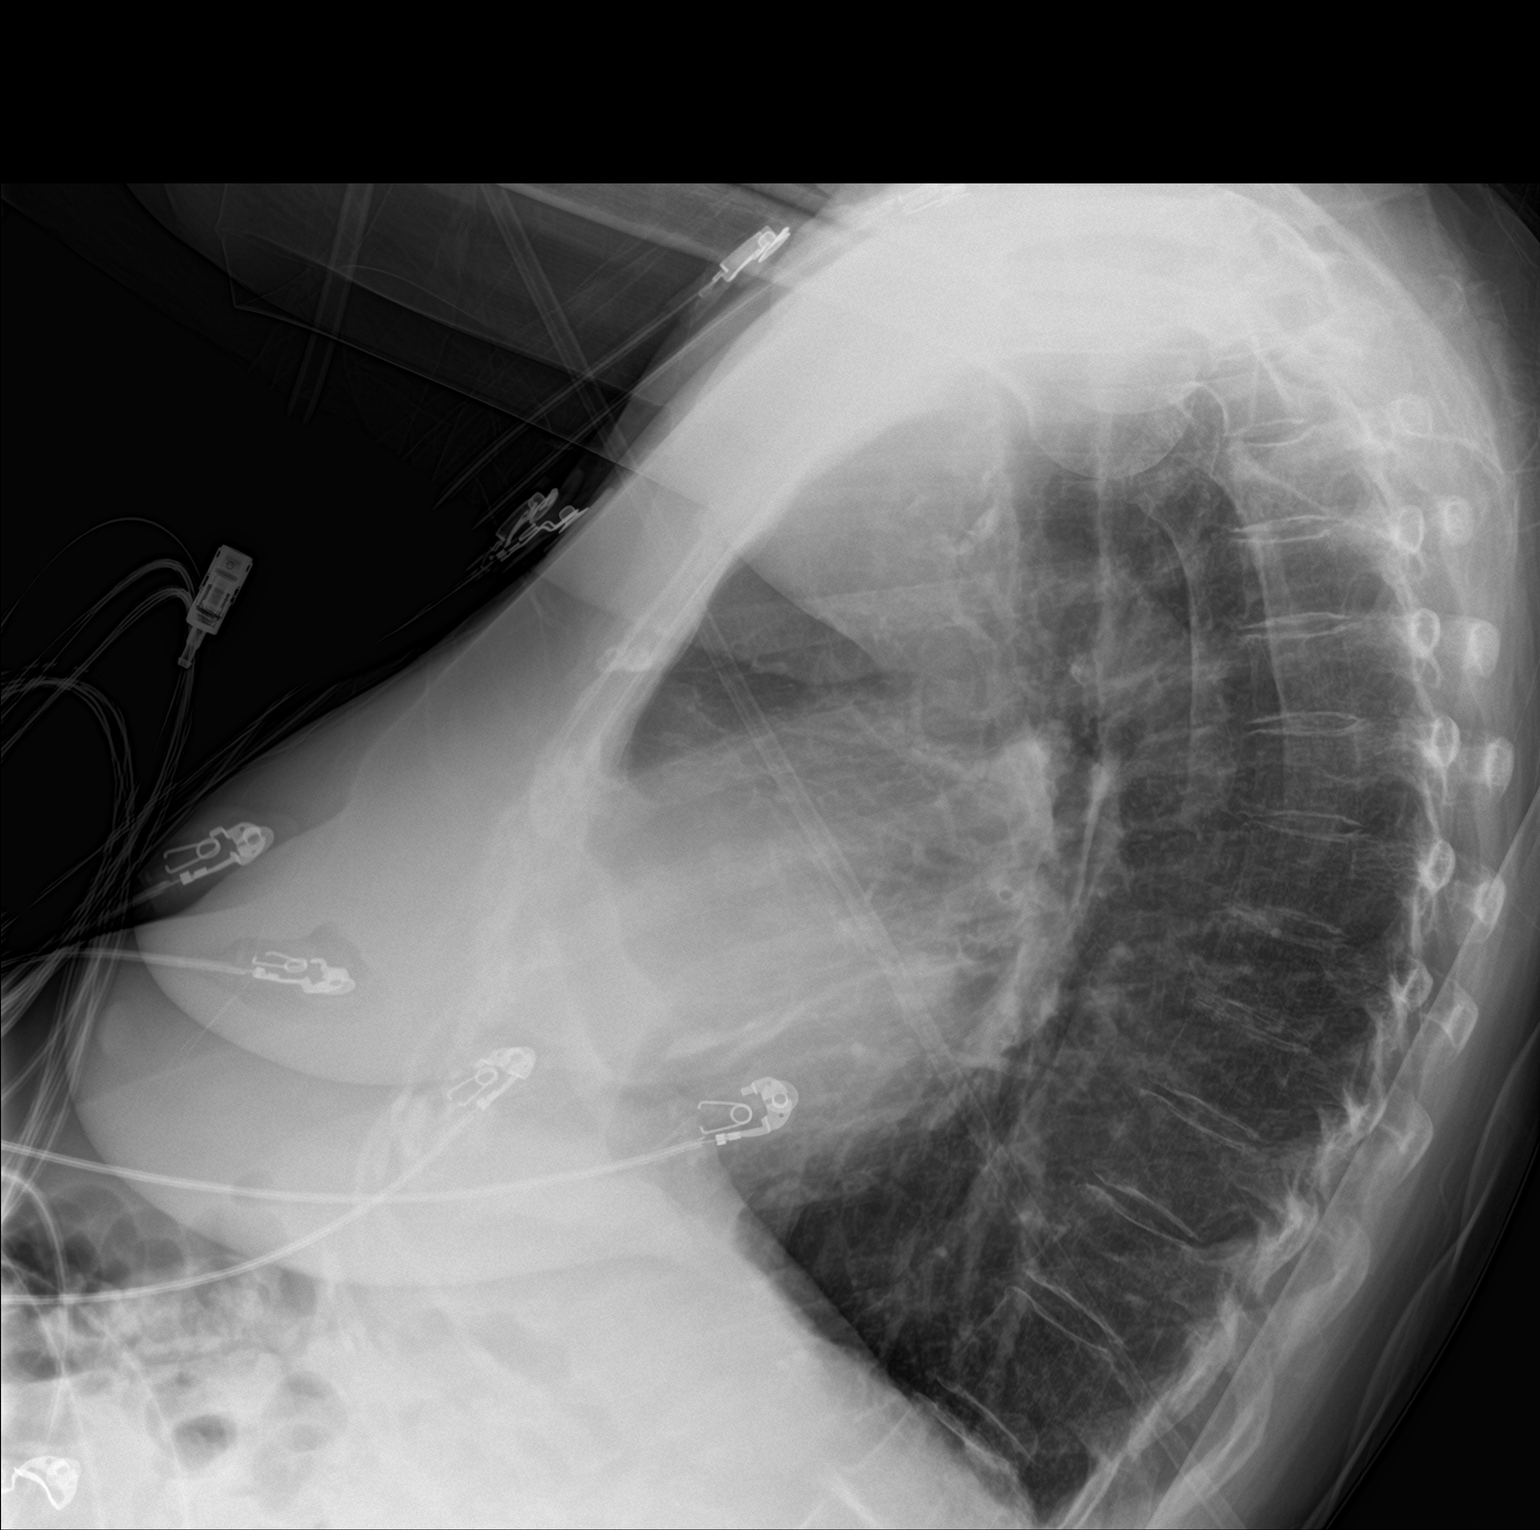

[chest ap]
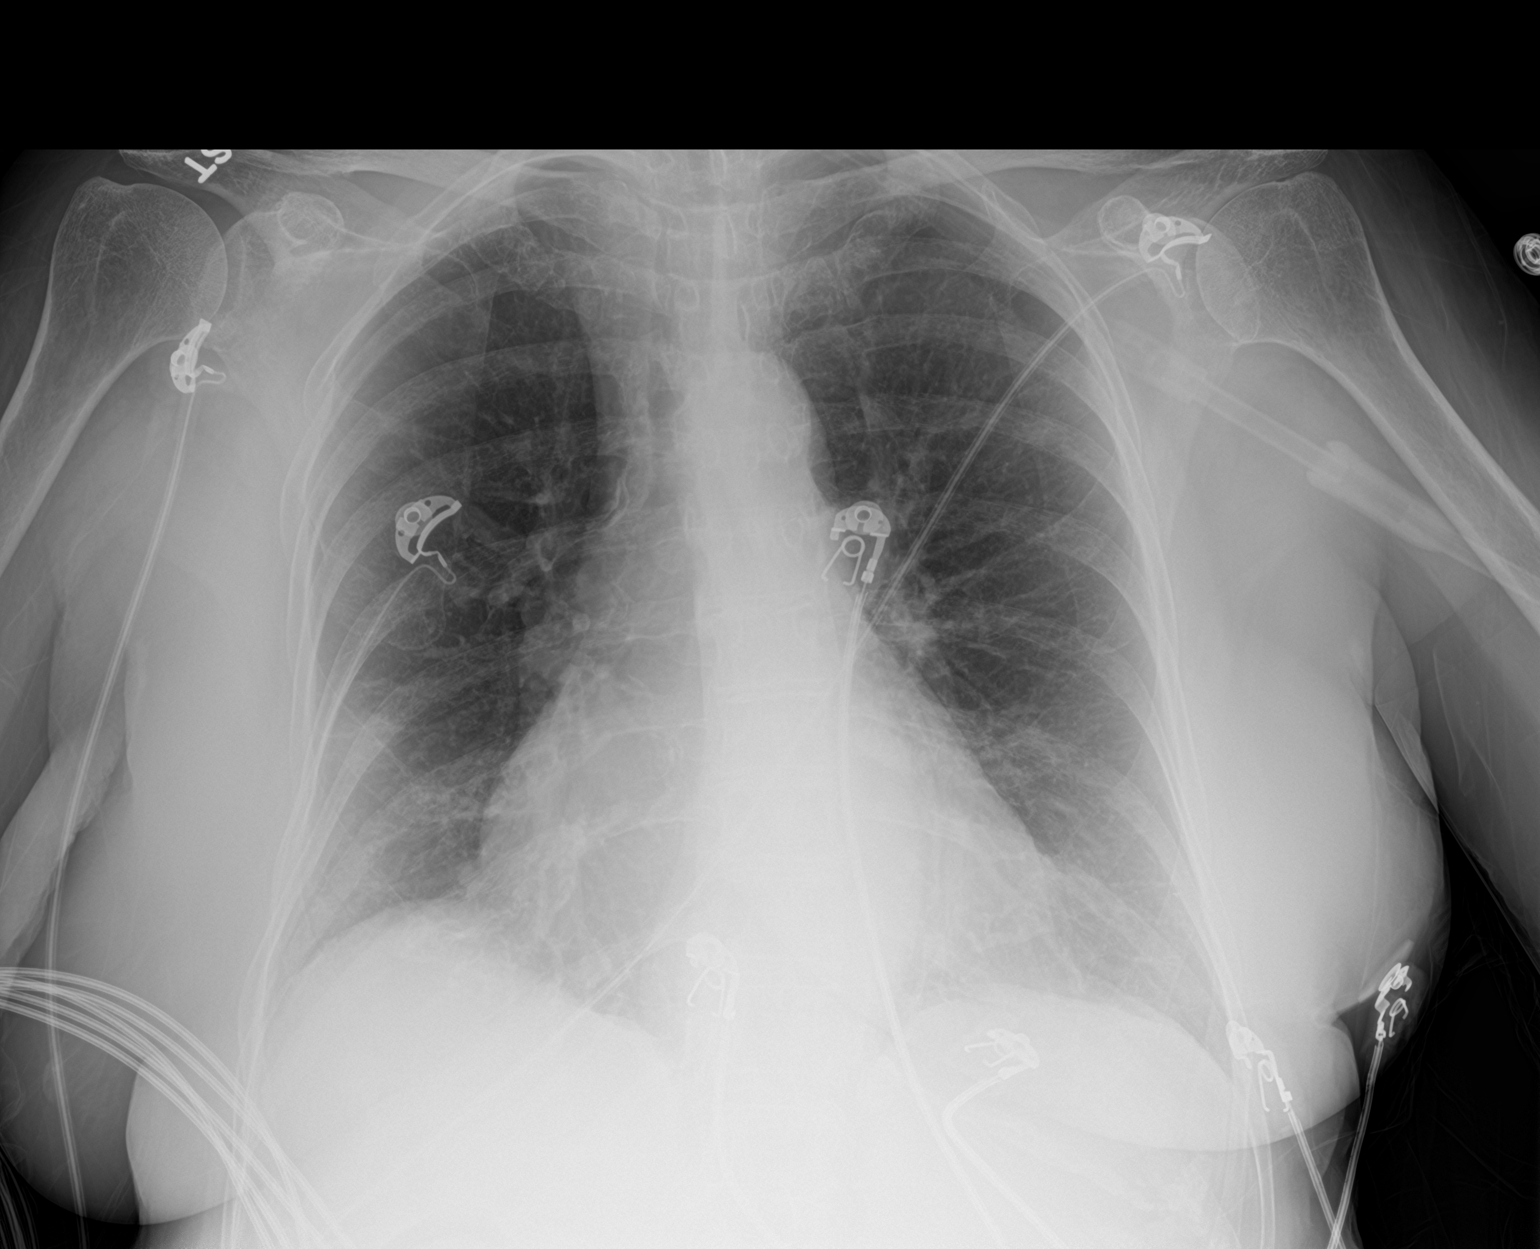

[2 of 2 positions shown; findings below may reference images not displayed]

FINDINGS: The lungs are clear. Faint nodular density in the right mid lung
field as seen on the prior radiograph. CT was recommended for better
evaluation. There is no pleural effusion or pneumothorax. Top-normal
cardiac silhouette. No acute osseous pathology.
IMPRESSION: 1. No acute cardiopulmonary process.
2. Faint nodular density in the right middle lobe. Correlation with
CT on a nonemergent basis recommended.
